# Patient Record
Sex: Male | Born: 1950 | Race: White | Hispanic: No
Health system: Southern US, Community
[De-identification: ages and names within clinical notes are randomized; demographics above are authoritative.]

## PROBLEM LIST (undated history)

## (undated) DIAGNOSIS — Z952 Presence of prosthetic heart valve: Secondary | ICD-10-CM

## (undated) DIAGNOSIS — I4891 Unspecified atrial fibrillation: Secondary | ICD-10-CM

## (undated) DIAGNOSIS — I2699 Other pulmonary embolism without acute cor pulmonale: Secondary | ICD-10-CM

## (undated) DIAGNOSIS — I82409 Acute embolism and thrombosis of unspecified deep veins of unspecified lower extremity: Secondary | ICD-10-CM

---

## 1999-04-27 DIAGNOSIS — Z950 Presence of cardiac pacemaker: Secondary | ICD-10-CM

## 1999-04-27 HISTORY — DX: Presence of cardiac pacemaker: Z95.0

## 1999-04-27 HISTORY — PX: MITRAL VALVE REPLACEMENT: SHX147

## 2019-09-14 ENCOUNTER — Encounter (HOSPITAL_COMMUNITY): Payer: Self-pay | Admitting: Primary Care

## 2019-09-14 ENCOUNTER — Emergency Department (HOSPITAL_COMMUNITY): Payer: Medicare HMO

## 2019-09-14 ENCOUNTER — Inpatient Hospital Stay (HOSPITAL_COMMUNITY)
Admission: EM | Admit: 2019-09-14 | Discharge: 2019-09-25 | DRG: 040 | Disposition: E | Payer: Medicare HMO | Attending: Internal Medicine | Admitting: Internal Medicine

## 2019-09-14 ENCOUNTER — Other Ambulatory Visit: Payer: Self-pay

## 2019-09-14 DIAGNOSIS — I2510 Atherosclerotic heart disease of native coronary artery without angina pectoris: Secondary | ICD-10-CM | POA: Diagnosis present

## 2019-09-14 DIAGNOSIS — F0390 Unspecified dementia without behavioral disturbance: Secondary | ICD-10-CM | POA: Diagnosis present

## 2019-09-14 DIAGNOSIS — E43 Unspecified severe protein-calorie malnutrition: Secondary | ICD-10-CM | POA: Diagnosis present

## 2019-09-14 DIAGNOSIS — E870 Hyperosmolality and hypernatremia: Secondary | ICD-10-CM | POA: Diagnosis not present

## 2019-09-14 DIAGNOSIS — L89152 Pressure ulcer of sacral region, stage 2: Secondary | ICD-10-CM | POA: Diagnosis present

## 2019-09-14 DIAGNOSIS — Z452 Encounter for adjustment and management of vascular access device: Secondary | ICD-10-CM

## 2019-09-14 DIAGNOSIS — I82A22 Chronic embolism and thrombosis of left axillary vein: Secondary | ICD-10-CM | POA: Diagnosis present

## 2019-09-14 DIAGNOSIS — Z951 Presence of aortocoronary bypass graft: Secondary | ICD-10-CM | POA: Diagnosis not present

## 2019-09-14 DIAGNOSIS — C7951 Secondary malignant neoplasm of bone: Secondary | ICD-10-CM | POA: Diagnosis present

## 2019-09-14 DIAGNOSIS — J9601 Acute respiratory failure with hypoxia: Secondary | ICD-10-CM | POA: Diagnosis not present

## 2019-09-14 DIAGNOSIS — M619 Calcification and ossification of muscle, unspecified: Secondary | ICD-10-CM | POA: Diagnosis present

## 2019-09-14 DIAGNOSIS — E46 Unspecified protein-calorie malnutrition: Secondary | ICD-10-CM | POA: Diagnosis not present

## 2019-09-14 DIAGNOSIS — Z20822 Contact with and (suspected) exposure to covid-19: Secondary | ICD-10-CM | POA: Diagnosis present

## 2019-09-14 DIAGNOSIS — R0602 Shortness of breath: Secondary | ICD-10-CM

## 2019-09-14 DIAGNOSIS — I059 Rheumatic mitral valve disease, unspecified: Secondary | ICD-10-CM | POA: Diagnosis present

## 2019-09-14 DIAGNOSIS — R1310 Dysphagia, unspecified: Secondary | ICD-10-CM | POA: Diagnosis not present

## 2019-09-14 DIAGNOSIS — N400 Enlarged prostate without lower urinary tract symptoms: Secondary | ICD-10-CM | POA: Diagnosis present

## 2019-09-14 DIAGNOSIS — G253 Myoclonus: Secondary | ICD-10-CM

## 2019-09-14 DIAGNOSIS — Z681 Body mass index (BMI) 19 or less, adult: Secondary | ICD-10-CM | POA: Diagnosis not present

## 2019-09-14 DIAGNOSIS — N136 Pyonephrosis: Secondary | ICD-10-CM | POA: Diagnosis present

## 2019-09-14 DIAGNOSIS — R972 Elevated prostate specific antigen [PSA]: Secondary | ICD-10-CM | POA: Diagnosis present

## 2019-09-14 DIAGNOSIS — Z950 Presence of cardiac pacemaker: Secondary | ICD-10-CM

## 2019-09-14 DIAGNOSIS — I4891 Unspecified atrial fibrillation: Secondary | ICD-10-CM | POA: Diagnosis present

## 2019-09-14 DIAGNOSIS — Z7189 Other specified counseling: Secondary | ICD-10-CM | POA: Diagnosis not present

## 2019-09-14 DIAGNOSIS — Z515 Encounter for palliative care: Secondary | ICD-10-CM | POA: Diagnosis not present

## 2019-09-14 DIAGNOSIS — I82B12 Acute embolism and thrombosis of left subclavian vein: Secondary | ICD-10-CM | POA: Diagnosis present

## 2019-09-14 DIAGNOSIS — M609 Myositis, unspecified: Secondary | ICD-10-CM | POA: Diagnosis present

## 2019-09-14 DIAGNOSIS — R4781 Slurred speech: Secondary | ICD-10-CM | POA: Diagnosis present

## 2019-09-14 DIAGNOSIS — R221 Localized swelling, mass and lump, neck: Secondary | ICD-10-CM | POA: Diagnosis present

## 2019-09-14 DIAGNOSIS — B961 Klebsiella pneumoniae [K. pneumoniae] as the cause of diseases classified elsewhere: Secondary | ICD-10-CM | POA: Diagnosis present

## 2019-09-14 DIAGNOSIS — N4889 Other specified disorders of penis: Secondary | ICD-10-CM | POA: Diagnosis present

## 2019-09-14 DIAGNOSIS — R1319 Other dysphagia: Secondary | ICD-10-CM | POA: Diagnosis not present

## 2019-09-14 DIAGNOSIS — Z7901 Long term (current) use of anticoagulants: Secondary | ICD-10-CM

## 2019-09-14 DIAGNOSIS — Z86711 Personal history of pulmonary embolism: Secondary | ICD-10-CM | POA: Diagnosis not present

## 2019-09-14 DIAGNOSIS — G9340 Encephalopathy, unspecified: Secondary | ICD-10-CM | POA: Diagnosis present

## 2019-09-14 DIAGNOSIS — I700 Atherosclerosis of aorta: Secondary | ICD-10-CM | POA: Diagnosis not present

## 2019-09-14 DIAGNOSIS — N5089 Other specified disorders of the male genital organs: Secondary | ICD-10-CM | POA: Diagnosis present

## 2019-09-14 DIAGNOSIS — Z86718 Personal history of other venous thrombosis and embolism: Secondary | ICD-10-CM

## 2019-09-14 DIAGNOSIS — Z79899 Other long term (current) drug therapy: Secondary | ICD-10-CM

## 2019-09-14 DIAGNOSIS — Z66 Do not resuscitate: Secondary | ICD-10-CM | POA: Diagnosis present

## 2019-09-14 DIAGNOSIS — G92 Toxic encephalopathy: Principal | ICD-10-CM

## 2019-09-14 DIAGNOSIS — R627 Adult failure to thrive: Secondary | ICD-10-CM | POA: Diagnosis present

## 2019-09-14 DIAGNOSIS — R933 Abnormal findings on diagnostic imaging of other parts of digestive tract: Secondary | ICD-10-CM | POA: Diagnosis not present

## 2019-09-14 DIAGNOSIS — R64 Cachexia: Secondary | ICD-10-CM | POA: Diagnosis present

## 2019-09-14 DIAGNOSIS — C159 Malignant neoplasm of esophagus, unspecified: Secondary | ICD-10-CM | POA: Diagnosis present

## 2019-09-14 DIAGNOSIS — C779 Secondary and unspecified malignant neoplasm of lymph node, unspecified: Secondary | ICD-10-CM | POA: Diagnosis present

## 2019-09-14 DIAGNOSIS — K769 Liver disease, unspecified: Secondary | ICD-10-CM | POA: Diagnosis present

## 2019-09-14 DIAGNOSIS — D630 Anemia in neoplastic disease: Secondary | ICD-10-CM | POA: Diagnosis present

## 2019-09-14 DIAGNOSIS — N320 Bladder-neck obstruction: Secondary | ICD-10-CM | POA: Diagnosis present

## 2019-09-14 DIAGNOSIS — N1330 Unspecified hydronephrosis: Secondary | ICD-10-CM | POA: Diagnosis present

## 2019-09-14 DIAGNOSIS — Z1612 Extended spectrum beta lactamase (ESBL) resistance: Secondary | ICD-10-CM | POA: Diagnosis present

## 2019-09-14 DIAGNOSIS — Z952 Presence of prosthetic heart valve: Secondary | ICD-10-CM

## 2019-09-14 DIAGNOSIS — K222 Esophageal obstruction: Secondary | ICD-10-CM | POA: Diagnosis present

## 2019-09-14 DIAGNOSIS — C799 Secondary malignant neoplasm of unspecified site: Secondary | ICD-10-CM | POA: Diagnosis present

## 2019-09-14 DIAGNOSIS — Z9359 Other cystostomy status: Secondary | ICD-10-CM

## 2019-09-14 DIAGNOSIS — L8990 Pressure ulcer of unspecified site, unspecified stage: Secondary | ICD-10-CM | POA: Diagnosis present

## 2019-09-14 DIAGNOSIS — R29715 NIHSS score 15: Secondary | ICD-10-CM | POA: Diagnosis present

## 2019-09-14 DIAGNOSIS — I495 Sick sinus syndrome: Secondary | ICD-10-CM | POA: Diagnosis present

## 2019-09-14 DIAGNOSIS — E876 Hypokalemia: Secondary | ICD-10-CM | POA: Diagnosis not present

## 2019-09-14 DIAGNOSIS — C770 Secondary and unspecified malignant neoplasm of lymph nodes of head, face and neck: Secondary | ICD-10-CM | POA: Diagnosis not present

## 2019-09-14 DIAGNOSIS — F17210 Nicotine dependence, cigarettes, uncomplicated: Secondary | ICD-10-CM | POA: Diagnosis present

## 2019-09-14 DIAGNOSIS — E785 Hyperlipidemia, unspecified: Secondary | ICD-10-CM | POA: Diagnosis present

## 2019-09-14 HISTORY — DX: Presence of prosthetic heart valve: Z95.2

## 2019-09-14 HISTORY — DX: Other pulmonary embolism without acute cor pulmonale: I26.99

## 2019-09-14 HISTORY — DX: Acute embolism and thrombosis of unspecified deep veins of unspecified lower extremity: I82.409

## 2019-09-14 HISTORY — DX: Unspecified atrial fibrillation: I48.91

## 2019-09-14 LAB — URINALYSIS, ROUTINE W REFLEX MICROSCOPIC
Bilirubin Urine: NEGATIVE
Glucose, UA: NEGATIVE mg/dL
Ketones, ur: NEGATIVE mg/dL
Nitrite: NEGATIVE
Protein, ur: NEGATIVE mg/dL
Specific Gravity, Urine: 1.025 (ref 1.005–1.030)
WBC, UA: >50 WBC/hpf — ABNORMAL HIGH (ref 0–5)
pH: 5 (ref 5.0–8.0)

## 2019-09-14 LAB — LACTIC ACID, PLASMA: Lactic Acid, Venous: 1.3 mmol/L (ref 0.5–1.9)

## 2019-09-14 LAB — DIFFERENTIAL
Abs Immature Granulocytes: 0.05 10*3/uL (ref 0.00–0.07)
Basophils Absolute: 0 10*3/uL (ref 0.0–0.1)
Basophils Relative: 0 %
Eosinophils Absolute: 0 10*3/uL (ref 0.0–0.5)
Eosinophils Relative: 0 %
Immature Granulocytes: 1 %
Lymphocytes Relative: 8 %
Lymphs Abs: 0.7 10*3/uL (ref 0.7–4.0)
Monocytes Absolute: 0.5 10*3/uL (ref 0.1–1.0)
Monocytes Relative: 6 %
Neutro Abs: 6.7 10*3/uL (ref 1.7–7.7)
Neutrophils Relative %: 85 %

## 2019-09-14 LAB — I-STAT CHEM 8, ED
BUN: 26 mg/dL — ABNORMAL HIGH (ref 8–23)
Calcium, Ion: 1.03 mmol/L — ABNORMAL LOW (ref 1.15–1.40)
Chloride: 115 mmol/L — ABNORMAL HIGH (ref 98–111)
Creatinine, Ser: 0.4 mg/dL — ABNORMAL LOW (ref 0.61–1.24)
Glucose, Bld: 113 mg/dL — ABNORMAL HIGH (ref 70–99)
HCT: 29 % — ABNORMAL LOW (ref 39.0–52.0)
Hemoglobin: 9.9 g/dL — ABNORMAL LOW (ref 13.0–17.0)
Potassium: 4.4 mmol/L (ref 3.5–5.1)
Sodium: 144 mmol/L (ref 135–145)
TCO2: 22 mmol/L (ref 22–32)

## 2019-09-14 LAB — CBC
HCT: 33.3 % — ABNORMAL LOW (ref 39.0–52.0)
Hemoglobin: 10.2 g/dL — ABNORMAL LOW (ref 13.0–17.0)
MCH: 27.9 pg (ref 26.0–34.0)
MCHC: 30.6 g/dL (ref 30.0–36.0)
MCV: 91.2 fL (ref 80.0–100.0)
Platelets: 233 10*3/uL (ref 150–400)
RBC: 3.65 MIL/uL — ABNORMAL LOW (ref 4.22–5.81)
RDW: 21.8 % — ABNORMAL HIGH (ref 11.5–15.5)
WBC: 8.2 10*3/uL (ref 4.0–10.5)
nRBC: 0 % (ref 0.0–0.2)

## 2019-09-14 LAB — COMPREHENSIVE METABOLIC PANEL
ALT: 21 U/L (ref 0–44)
AST: 21 U/L (ref 15–41)
Albumin: 1.6 g/dL — ABNORMAL LOW (ref 3.5–5.0)
Alkaline Phosphatase: 206 U/L — ABNORMAL HIGH (ref 38–126)
Anion gap: 9 (ref 5–15)
BUN: 22 mg/dL (ref 8–23)
CO2: 21 mmol/L — ABNORMAL LOW (ref 22–32)
Calcium: 7.8 mg/dL — ABNORMAL LOW (ref 8.9–10.3)
Chloride: 115 mmol/L — ABNORMAL HIGH (ref 98–111)
Creatinine, Ser: 0.48 mg/dL — ABNORMAL LOW (ref 0.61–1.24)
GFR calc Af Amer: >60 mL/min (ref >60)
GFR calc non Af Amer: >60 mL/min (ref >60)
Glucose, Bld: 119 mg/dL — ABNORMAL HIGH (ref 70–99)
Potassium: 4.1 mmol/L (ref 3.5–5.1)
Sodium: 145 mmol/L (ref 135–145)
Total Bilirubin: 0.7 mg/dL (ref 0.3–1.2)
Total Protein: 4.3 g/dL — ABNORMAL LOW (ref 6.5–8.1)

## 2019-09-14 LAB — PROTIME-INR
INR: 1.8 — ABNORMAL HIGH (ref 0.8–1.2)
Prothrombin Time: 20.3 seconds — ABNORMAL HIGH (ref 11.4–15.2)

## 2019-09-14 LAB — RAPID URINE DRUG SCREEN, HOSP PERFORMED
Amphetamines: NOT DETECTED
Barbiturates: NOT DETECTED
Benzodiazepines: NOT DETECTED
Cocaine: NOT DETECTED
Opiates: NOT DETECTED
Tetrahydrocannabinol: NOT DETECTED

## 2019-09-14 LAB — ETHANOL: Alcohol, Ethyl (B): <10 mg/dL (ref <10)

## 2019-09-14 LAB — AMMONIA: Ammonia: 20 umol/L (ref 9–35)

## 2019-09-14 LAB — TSH: TSH: 8.318 u[IU]/mL — ABNORMAL HIGH (ref 0.350–4.500)

## 2019-09-14 LAB — SARS CORONAVIRUS 2 BY RT PCR (HOSPITAL ORDER, PERFORMED IN ~~LOC~~ HOSPITAL LAB): SARS Coronavirus 2: NEGATIVE

## 2019-09-14 LAB — APTT: aPTT: 35 seconds (ref 24–36)

## 2019-09-14 LAB — CBG MONITORING, ED: Glucose-Capillary: 107 mg/dL — ABNORMAL HIGH (ref 70–99)

## 2019-09-14 LAB — VITAMIN B12: Vitamin B-12: 973 pg/mL — ABNORMAL HIGH (ref 180–914)

## 2019-09-14 MED ORDER — DOXEPIN HCL 25 MG PO CAPS
25.0000 mg | ORAL_CAPSULE | Freq: Every day | ORAL | Status: DC
  Filled 2019-09-14: qty 1

## 2019-09-14 MED ORDER — SODIUM CHLORIDE 0.9 % IV BOLUS
500.0000 mL | Freq: Once | INTRAVENOUS | Status: AC
  Administered 2019-09-14: 500 mL via INTRAVENOUS

## 2019-09-14 MED ORDER — HEPARIN BOLUS VIA INFUSION
3500.0000 [IU] | Freq: Once | INTRAVENOUS | Status: DC
  Filled 2019-09-14: qty 3500

## 2019-09-14 MED ORDER — IOHEXOL 300 MG/ML  SOLN
100.0000 mL | Freq: Once | INTRAMUSCULAR | Status: AC | PRN
  Administered 2019-09-14: 100 mL via INTRAVENOUS

## 2019-09-14 MED ORDER — TAMSULOSIN HCL 0.4 MG PO CAPS
0.4000 mg | ORAL_CAPSULE | Freq: Every day | ORAL | Status: DC
  Administered 2019-09-15 – 2019-09-19 (×4): 0.4 mg via ORAL
  Filled 2019-09-14 (×8): qty 1

## 2019-09-14 MED ORDER — HEPARIN (PORCINE) 25000 UT/250ML-% IV SOLN
1150.0000 [IU]/h | INTRAVENOUS | Status: DC
  Administered 2019-09-14: 850 [IU]/h via INTRAVENOUS
  Administered 2019-09-16: 1150 [IU]/h via INTRAVENOUS
  Administered 2019-09-16: 1100 [IU]/h via INTRAVENOUS
  Administered 2019-09-17: 1150 [IU]/h via INTRAVENOUS
  Filled 2019-09-14 (×3): qty 250

## 2019-09-14 MED ORDER — SODIUM CHLORIDE 0.9 % IV SOLN: INTRAVENOUS | Status: DC

## 2019-09-14 MED ORDER — MELATONIN 5 MG PO TABS
10.0000 mg | ORAL_TABLET | Freq: Every day | ORAL | Status: DC
  Administered 2019-09-15 – 2019-09-18 (×4): 10 mg via ORAL
  Filled 2019-09-14 (×5): qty 2

## 2019-09-14 MED ORDER — MIRTAZAPINE 15 MG PO TABS
15.0000 mg | ORAL_TABLET | Freq: Every day | ORAL | Status: DC
  Filled 2019-09-14: qty 1

## 2019-09-14 MED ORDER — ATORVASTATIN CALCIUM 80 MG PO TABS
80.0000 mg | ORAL_TABLET | Freq: Every day | ORAL | Status: DC
  Administered 2019-09-15 – 2019-09-21 (×6): 80 mg via ORAL
  Filled 2019-09-14 (×7): qty 1

## 2019-09-14 MED ORDER — HYDROMORPHONE HCL 1 MG/ML IJ SOLN
0.5000 mg | INTRAMUSCULAR | Status: DC | PRN
  Administered 2019-09-17 – 2019-09-18 (×2): 0.5 mg via INTRAVENOUS
  Administered 2019-09-19: 0.25 mg via INTRAVENOUS
  Administered 2019-09-19 – 2019-09-21 (×4): 1 mg via INTRAVENOUS
  Filled 2019-09-14 (×2): qty 1
  Filled 2019-09-14: qty 0.5
  Filled 2019-09-14 (×3): qty 1
  Filled 2019-09-14: qty 0.5

## 2019-09-14 MED ORDER — SENNOSIDES-DOCUSATE SODIUM 8.6-50 MG PO TABS
1.0000 | ORAL_TABLET | Freq: Every day | ORAL | Status: DC | PRN
  Filled 2019-09-14: qty 1

## 2019-09-14 MED ORDER — FLUOXETINE HCL 10 MG PO CAPS
10.0000 mg | ORAL_CAPSULE | Freq: Every day | ORAL | Status: DC
  Administered 2019-09-15 – 2019-09-21 (×6): 10 mg via ORAL
  Filled 2019-09-14 (×8): qty 1

## 2019-09-14 MED ORDER — METOPROLOL TARTRATE 25 MG PO TABS
25.0000 mg | ORAL_TABLET | Freq: Two times a day (BID) | ORAL | Status: DC
  Administered 2019-09-15 – 2019-09-19 (×9): 25 mg via ORAL
  Filled 2019-09-14 (×11): qty 1

## 2019-09-14 MED ORDER — SODIUM CHLORIDE 0.9 % IV SOLN
1.0000 g | INTRAVENOUS | Status: DC
  Administered 2019-09-14 – 2019-09-17 (×4): 1000 mg via INTRAVENOUS
  Filled 2019-09-14 (×6): qty 1

## 2019-09-14 MED ORDER — AMIODARONE HCL 200 MG PO TABS
200.0000 mg | ORAL_TABLET | Freq: Two times a day (BID) | ORAL | Status: DC
  Administered 2019-09-15 – 2019-09-21 (×12): 200 mg via ORAL
  Filled 2019-09-14 (×13): qty 1

## 2019-09-14 MED ORDER — NICOTINE 21 MG/24HR TD PT24
21.0000 mg | MEDICATED_PATCH | Freq: Every day | TRANSDERMAL | Status: DC
  Administered 2019-09-15 – 2019-09-23 (×8): 21 mg via TRANSDERMAL
  Filled 2019-09-14 (×9): qty 1

## 2019-09-14 MED ORDER — THIAMINE HCL 100 MG/ML IJ SOLN
500.0000 mg | Freq: Three times a day (TID) | INTRAVENOUS | Status: AC
  Administered 2019-09-14 – 2019-09-17 (×8): 500 mg via INTRAVENOUS
  Filled 2019-09-14 (×10): qty 5

## 2019-09-14 NOTE — ED Triage Notes (Signed)
Pt brought to ED from SNF via EMS after family discovered pt this AM around 7:30 with slurred speech, confusion. LKW 8PM yesterday. Hx afib on warfarin, pacemaker, mitral valve replacement. Pt alert and stable on arrival to ED, transferred to CT.

## 2019-09-14 NOTE — ED Provider Notes (Signed)
[facility] Emergency Department Provider Note MRN:  [id number]  Arrival date & time: 08/28/2019     Chief Complaint   Code Stroke   History of Present Illness   Jorge Kennedy is a 69 y.o. year-old male with a history of CAD, PE, pacemaker presenting to the ED with chief complaint of code stroke.  Waxing and waning altered mental status and slurred speech for the past 2 or 3 days.  Code stroke initiated prior to arrival.  Patient is altered and cannot provide history.  Per family, patient was very normal, much healthier, dancing at a wedding this past February, significant functional decline since that time.  [place] off a ladder back in November, had significant internal bleeding at that time, has not been the same since.  Has had an extended hospitalization during March and April at hospital in [place], discharged to a skilled nursing facility.  I was unable to obtain an accurate HPI, PMH, or ROS due to the patient's altered mental status.  Level 5 caveat.  Review of Systems  Positive for altered mental status, slurred speech.  Patient's Health History    Past Medical History:  Diagnosis Date  . Atrial fibrillation with RVR ([place])   . DVT (deep venous thrombosis) ([place])   . H/O mitral valve replacement with mechanical valve   . Pacemaker 2001  . PE (pulmonary thromboembolism) ([place])     Past Surgical History:  Procedure Laterality Date  . MITRAL VALVE REPLACEMENT  2001    No family history on file.  Social History   Socioeconomic History  . Marital status: Widowed    Spouse name: Not on file  . Number of children: Not on file  . Years of education: Not on file  . Highest education level: Not on file  Occupational History  . Not on file  Tobacco Use  . Smoking status: Not on file  Substance and Sexual Activity  . Alcohol use: Not on file  . Drug use: Not on file  . Sexual activity: Not on file  Other Topics Concern  . Not on file  Social  History Narrative  . Not on file   Social Determinants of Health   Financial Resource Strain:   . Difficulty of Paying Living Expenses:   Food Insecurity:   . Worried About Charity fundraiser in the Last Year:   . Arboriculturist in the Last Year:   Transportation Needs:   . Film/video editor (Medical):   Marland Kitchen Lack of Transportation (Non-Medical):   Physical Activity:   . Days of Exercise per Week:   . Minutes of Exercise per Session:   Stress:   . Feeling of Stress :   Social Connections:   . Frequency of Communication with Friends and Family:   . Frequency of Social Gatherings with Friends and Family:   . Attends Religious Services:   . Active Member of Clubs or Organizations:   . Attends Archivist Meetings:   Marland Kitchen Marital Status:   Intimate Partner Violence:   . Fear of Current or Ex-Partner:   . Emotionally Abused:   Marland Kitchen Physically Abused:   . Sexually Abused:      Physical Exam   Vitals:   08/30/2019 1315 09/22/2019 1345  BP: (!) 111/96 118/65  Pulse: 87 87  Resp: (!) 23 19  Temp:    SpO2: 97% 98%    CONSTITUTIONAL: Ill-appearing, NAD, cachectic NEURO: Awake, not oriented, moves all  extremities, mildly tremulous EYES:  eyes equal and reactive ENT/NECK:  no LAD, no JVD, solid mass to left lateral and anterior neck CARDIO: Regular rate, well-perfused, normal S1 and S2 PULM:  CTAB no wheezing or rhonchi GI/GU:  normal bowel sounds, non-distended, non-tender MSK/SPINE:  No gross deformities, no edema SKIN:  no rash, atraumatic PSYCH:  Appropriate speech and behavior  *Additional and/or pertinent findings included in MDM below  Diagnostic and Interventional Summary    EKG Interpretation  Date/Time:  Friday Sep 14 2019 11:10:01 EDT Ventricular Rate:  97 PR Interval:    QRS Duration: 153 QT Interval:  471 QTC Calculation: 544 R Axis:   -101 Text Interpretation: Atrial fibrillation Ventricular premature complex IVCD, consider atypical RBBB Abnormal  lateral Q waves Confirmed by Gerlene Fee [phone]) on 09/05/2019 12:19:18 PM      Labs Reviewed  Meda Klinefelter - Abnormal; Notable for the following components:      Result Value   Prothrombin Time 20.3 (*)    INR 1.8 (*)    All other components within normal limits  CBC - Abnormal; Notable for the following components:   RBC 3.65 (*)    Hemoglobin 10.2 (*)    HCT 33.3 (*)    RDW 21.8 (*)    All other components within normal limits  COMPREHENSIVE METABOLIC PANEL - Abnormal; Notable for the following components:   Chloride 115 (*)    CO2 21 (*)    Glucose, Bld 119 (*)    Creatinine, Ser 0.48 (*)    Calcium 7.8 (*)    Total Protein 4.3 (*)    Albumin 1.6 (*)    Alkaline Phosphatase 206 (*)    All other components within normal limits  URINALYSIS, ROUTINE W REFLEX MICROSCOPIC - Abnormal; Notable for the following components:   APPearance HAZY (*)    Hgb urine dipstick MODERATE (*)    Leukocytes,Ua MODERATE (*)    WBC, UA >50 (*)    Bacteria, UA RARE (*)    All other components within normal limits  VITAMIN B12 - Abnormal; Notable for the following components:   Vitamin B-12 973 (*)    All other components within normal limits  TSH - Abnormal; Notable for the following components:   TSH 8.318 (*)    All other components within normal limits  CBG MONITORING, ED - Abnormal; Notable for the following components:   Glucose-Capillary 107 (*)    All other components within normal limits  I-STAT CHEM 8, ED - Abnormal; Notable for the following components:   Chloride 115 (*)    BUN 26 (*)    Creatinine, Ser 0.40 (*)    Glucose, Bld 113 (*)    Calcium, Ion 1.03 (*)    Hemoglobin 9.9 (*)    HCT 29.0 (*)    All other components within normal limits  SARS CORONAVIRUS 2 BY RT PCR (HOSPITAL ORDER, [place] [facility])  ETHANOL  APTT  DIFFERENTIAL  RAPID URINE DRUG SCREEN, HOSP PERFORMED  AMMONIA  LACTIC ACID, PLASMA  VITAMIN B1  I-STAT CHEM 8, ED    CT CHEST  W CONTRAST  Final Result    CT ABDOMEN PELVIS W CONTRAST  Final Result    CT Soft Tissue Neck W Contrast  Final Result    CT HEAD CODE STROKE WO CONTRAST  Final Result      Medications  sodium chloride 0.9 % bolus 500 mL (0 mLs Intravenous Stopped 09/04/2019 1345)  iohexol (OMNIPAQUE)  300 MG/ML solution 100 mL (100 mLs Intravenous Contrast Given 09/17/2019 1404)     Procedures  /  Critical Care .Critical Care Performed by: Maudie Flakes, MD Authorized by: Maudie Flakes, MD   Critical care provider statement:    Critical care time (minutes):  45   Critical care was necessary to treat or prevent imminent or life-threatening deterioration of the following conditions:  CNS failure or compromise   Critical care was time spent personally by me on the following activities:  Discussions with consultants, evaluation of patient's response to treatment, examination of patient, ordering and performing treatments and interventions, ordering and review of laboratory studies, ordering and review of radiographic studies, pulse oximetry, re-evaluation of patient's condition, obtaining history from patient or surrogate and review of old charts    ED Course and Medical Decision Making  I have reviewed the triage vital signs, the nursing notes, and pertinent available records from the EMR.  Listed above are laboratory and imaging tests that I personally ordered, reviewed, and interpreted and then considered in my medical decision making (see below for details).      Concern for underlying malignancy given patient's appearance and significant decline over the past few months, as well as the solid mass to the left side of the neck.  Also considering UTI, metabolic disarray, on warfarin and a small stroke is a possibility, noncontrast CT showing no bleeding, not a TPA candidate given the anticoagulation.  Awaiting labs, CT imaging.  CT imaging is concerning for metastatic cancer, unclear primary,  likely tumor in the soft tissues of the neck with adjacent subclavian vein thrombus.  Also seems to be some bleeding within the tumor.  Given this finding and patient's severe malnutrition, will admit to hospitalist service.  Discussed case with neuro, who is following and providing recommendations.  Given the concern for possible meningeal spread of metastatic cancer, they are recommending LP.  However given patient's anticoagulation and elevated INR I feel it is best that this be performed under fluoroscopic guidance.  Barth Kirks. Sedonia Small, MD [place] [email or web address]  Final Clinical Impressions(s) / ED Diagnoses     ICD-10-CM   1. Neck mass  R22.1   2. Metastatic malignant neoplasm, unspecified site ([place])  C79.9   3. Encephalopathy  G93.40   4. Malnutrition, unspecified type [facility])  [place]     ED Discharge Orders    None       Discharge Instructions Discussed with and Provided to Patient:   Discharge Instructions   None       Maudie Flakes, MD 09/19/2019 [phone]

## 2019-09-14 NOTE — ED Notes (Signed)
Pt from [facility] at [facility]

## 2019-09-14 NOTE — Plan of Care (Addendum)
SAME DAY PROGRESS NOTE  EEG: Primary EEG negative for status epilepticus.  Formal read pending  Imaging: CT chest abdomen pelvis as well as CT soft tissue neck: -Scattered sclerotic and likely lytic lesions throughout the thoracic and lumbar spine worrisome for neoplastic process.  Primary not identified -3 hypoattenuating lesions in the liver cannot be definitively characterized-MRI with and without contrast could be used for further evaluation.  Based on the imaging findings, MRI thoracic and lumbar spine is likely to be higher yield initially for cancer. -Multifocal muscle edema and calcifications cannot be definitively characterized and more worrisome for polymyositis.  Consult with room or muscle biopsy. -Moderate bilateral hydronephrosis likely due to marked prostatomegaly. -Small bilateral pleural effusions and small volume of abdominopelvic ascites. -Large ill-defined soft tissue mass in the left upper back extending into the lower neck and left axilla-this has ill-defined margins and stranding in the adjacent fat which appears to be hemorrhage within the mass, this is most likely due to malignancy/metastatic disease with hemorrhage.  Left supraclavicular mass most consistent with malignant adenopathy.  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  Patient has a left-sided pacemaker.  Updated assessment and recommendations Note based on the above findings, his likely presentation is secondary to metastatic disease. It would be prudent to do a spinal tap to look for any metastatic meningeal spread-MRI is not possible due to pacemaker so LP is the best modality to pursue that but he is anticoagulated.  I would first complete all the metastatic and search for primary and then consider doing a spinal tap at some point.  I would not hold any of his  anticoagulation for his DVTs or PEs for now.  Spoke with Dr. Sedonia Small and relayed the plan.   -- Amie Portland, MD [facility] Pager: [phone] If 7pm to 7am, please call on call as listed on AMION.

## 2019-09-14 NOTE — Progress Notes (Signed)
EEG complete - results pending 

## 2019-09-14 NOTE — ED Notes (Signed)
Family speaking with dr Rory Percy

## 2019-09-14 NOTE — ED Notes (Signed)
Suprapubic catheter and rt arm power PICC in place

## 2019-09-14 NOTE — ED Notes (Signed)
Pt returned from CT °

## 2019-09-14 NOTE — Progress Notes (Signed)
Called to speak with RN to see if pt is back from CT. RN said it will be awhile before pt is back in the room.

## 2019-09-14 NOTE — Progress Notes (Signed)
Pt in CT will try back later

## 2019-09-14 NOTE — Consult Note (Addendum)
NEURO HOSPITALIST  CONSULT   Requesting Physician: Dr. Sedonia Small    Chief Complaint: slurred speech  History obtained from:  Family / OSH chart review HPI:                                                                                                                                         Jorge Kennedy is an 69 y.o. 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   Per EMS  patient was discovered  0730 with slurred speech and confusion.patient had a planned transport for PEG tube placement this morning, but code stroke was activated by EMS d/ t slurred speech and facial droop. Per family patient has had slurred speech for the past few days. He was admitted on 4/25 to a [facility] hospital; and has recently been moved to [facility] SNF in [place]. His health has been declining since February.   ED course:  CTH: no hemorrhage BP: 105/64 BG; 113   [facility]: patient admitted 08/19/19. PICC for kleibsiella pneumonia 09/09/19 failure PEG tube placement Has stage 2 decubitus sacral ulcer FTT; per family patietn has not eaten in about 3-4 weeks. He was on marinol and megace to improve appetite without improvement.  Patient also has a suprapubic catheter   Date last known well: 2000 Time last known well: 09/13/2019 tPA Given: no on coumadin and outside of window Modified Rankin: Rankin Score=2 NIHSS:15   Past Medical History:  Diagnosis Date  . Atrial fibrillation with RVR ([place])   . DVT (deep venous thrombosis) ([place])   . H/O mitral valve replacement with mechanical valve   . Pacemaker 2001  . PE (pulmonary thromboembolism) ([place])     Past Surgical History:  Procedure Laterality Date  . MITRAL VALVE REPLACEMENT  2001    No family history on file.       Social History:  has no history on file for tobacco,  alcohol, and drug.  Allergies: Not on File  Medications:  Current Facility-Administered Medications  Medication Dose Route Frequency Provider Last Rate Last Admin  . sodium chloride 0.9 % bolus 500 mL  500 mL Intravenous Once Jorge Flakes, MD       No current outpatient medications on file.     ROS:                                                                                                                                       ROS was performed and is negative except as noted in HPI    General Examination:                                                                                                      There were no vitals taken for this visit.  Physical Exam  Constitutional: appears undernourished, FTT  Eyes: Normal external eye and conjunctiva. HENT: Normocephalic, no lesions, without obvious abnormality.  Musculoskeletal-no joint tenderness, deformity or swelling Cardiovascular: Normal rate and regular rhythm.  Respiratory: Effort normal, non-labored breathing saturations WNL GI: lower abdominal dressing  Skin: WD sacral ulcer ( reported)  Neurological Examination Mental Status: Alert, not oriented, dysarthria. .  Moderate aphasia. Able to follow some simple commands without difficulty. Cranial Nerves:  blinks to threat bilaterally, face appears symmetric.midline tongue extension. Motor: Able to raise all 4 extremities anti gravity. Drift present in all 4 extremities.  Tone and bulk:normal tone throughout; no atrophy noted Sensory: light touch intact throughout, bilaterally Cerebellar:  significant asterixis present.  Gait:  deferred   Lab Results: Basic Metabolic Panel: Recent Labs  Lab 08/27/2019 1045  NA 144  K 4.4  CL 115*  GLUCOSE 113*  BUN 26*  CREATININE 0.40*    CBC: Recent Labs  Lab 09/24/2019 1045  HGB 9.9*  HCT  29.0*    CBG: Recent Labs  Lab 09/09/2019 1042  GLUCAP 107*    Imaging: CT HEAD CODE STROKE WO CONTRAST  Result Date: 09/13/2019 CLINICAL DATA:  Code stroke.  Slurred speech and facial droop. EXAM: CT HEAD WITHOUT CONTRAST TECHNIQUE: Contiguous axial images were obtained from the base of the skull through the vertex without intravenous contrast. COMPARISON:  None. FINDINGS: Brain: Advanced atrophy. Extensive white matter disease bilaterally which is symmetric and most consistent with chronic microvascular ischemia. Negative for acute infarct, hemorrhage, mass. Vascular: Normal arterial flow voids. Skull: Negative Sinuses/Orbits: Paranasal sinuses clear.  Negative orbit. Other: None ASPECTS ([place] Stroke Program Early CT Score) - Ganglionic level infarction (caudate, lentiform nuclei,  internal capsule, insula, M1-M3 cortex): 7 - Supraganglionic infarction (M4-M6 cortex): 3 Total score (0-10 with 10 being normal): 10 IMPRESSION: 1. No acute abnormality 2. ASPECTS is 10 3. Advanced atrophy and chronic microvascular ischemic change. 4. Preliminary results texted to [place] via AMION Electronically Signed   By: Jorge Kennedy M.D.   On: 09/02/2019 11:00       Jorge Morale, Jorge Kennedy, Jorge Kennedy [facility] [phone]  09/10/2019, 10:55 AM   Attending physician note to follow with Assessment and plan .   Assessment: 69 y.o. male  with PMH a fib RVR, CABG with pacemaker, mechanical mitral valve (on coumadin) who presented to [facility] ED as a code stroke for slurred speech.  CTH was negative for hemorrhage. Patient is not a candidate for TPA d/t coumadin. Per history and exam this is most likely toxic metabolic encephalopathy. D/t stroke risk factors will complete stroke work-up as well.  Stroke Risk Factors - atrial fibrillation    Recommendations: -CBC, CMP, ammonia, TSH, thiamine, B12, UA,  --please make sure labs have been drawn before ordering and giving thiamine 500 TID for 3 days. -  patient unable to have a an MRI d/t pacemaker -pt/ot/ speech -bedside swallow, NPO if fails bedside swallow - continue anticoagulation -high intensity statin if LDL > 70 -- ECHO   --Please page the Stroke team from 8am-4pm.   You can look them up on [email or web address]    Attending Addendum Patient seen and examined as an acute code stroke on the request of Dr. Sedonia Small. He is coming from a facility where he has been recovering after a prolonged series of hospitalizations which were in [place].  The patient has a past medical history of A. fib with RVR, myositis ossificans, CABG with pacemaker, mechanical mitral valve on Coumadin bridged with Lovenox, malnutrition, DVT, PE, recent admission for a Klebsiella UTI in [place], recuperating in a facility where EMS was called to take him to the hospital for a possible PEG tube placement.  They noticed that he has slurred speech.  They checked with the family who reported stuttering symptoms of slurred speech and altered mental status that have been now going on for weeks to months.  They report that in February he was very independent and cognitively very much more intact than he is now and it has been a very rapid progression downhill. Patient was brought in for sudden onset of altered mental status and slurred speech. Multiple providers including the ED provider and the stroke team nurses obtained a history and confirmed that his symptoms have now been ongoing for a while. Does not look like an acute code stroke situation because there is not a clear last known well time. Review of systems: Has been very sick recently with decreased appetite and has lost a lot of weight. Examination: General: Cachectic appearing man HEENT: Normocephalic atraumatic.  There is a vague palpable hard mass on the left supraclavicular fossa. Respiratory: Saturating well on room air Neurological exam Awake, not oriented to person place or time. Follow simple  commands.  Unable to follow complex commands. Moderate dysarthria Poor attention concentration Cranial nerves: Pupils appear equal round react light, extraocular movements appear unrestricted, blinks to threat from both sides, face appears symmetric, tongue midline. Motor exam: Able to raise all 4 extremities antigravity with twitching seen in all muscles with asterixis on outstretched arms as well. Sensory exam: Intact to touch all over Cerebellar: Unable to perform finger-nose-finger bilaterally.  Asterixis on outstretched arm  Lab  review-BMP reveals creatinine of 0.8, alkaline phosphatase 206.  CBC with no leukocytosis.  Anemia with hemoglobin 10.2.  Imaging-CT head code stroke reveals extensive atrophy and white matter disease without any evidence of acute evolving infarct.  No bleed.  Assessment: 69 year old man with above past medical history presenting for waxing and waning mentation with slurred speech. On examination he appears very cachectic with a solid mass in the left supraclavicular fossa. He appears altered with significant slurred speech and asterixis. It is unclear what his etiology for current presentation is but his symptoms are by no means acute and has been worsening over the last few months. He is not a candidate for TPA for the above reason Vessel imaging was not performed due to a nonfocal exam as above as well. Due to his recent history of weight loss, solid mass in the left supraclavicular fossa and cachectic appearance, malignancy work-up should be done.  Impression: Multifactorial toxic metabolic encephalopathy Evaluate for underlying malignancy Evaluate for seizures No evolving stroke seen on CT head.  MRI not possible due to noncompatible pacemaker.  Recommendations: -Check ammonia levels, Check TSH -Check B12 levels.  UA chest x-ray. -Check thiamine levels if not already on supplementation -Check CT chest abdomen pelvis as well as CT of the neck to  evaluate for the mass palpated on exam. -No bleed on CT head-can continue anticoagulation. -EEG -Supplement high-dose thiamine-500 mg 3 times daily for 3 days followed by 250 mg daily for 5 days and then followed by regular 100 mg supplementation. -We will follow with you.  -- Jorge Portland, MD [facility] Pager: [phone] If 7pm to 7am, please call on call as listed on AMION.  CRITICAL CARE ATTESTATION Performed by: Jorge Portland, MD Total critical care time: 55 minutes Critical care time was exclusive of separately billable procedures and treating other patients and/or supervising APPs/Residents/Students Critical care was necessary to treat or prevent imminent or life-threatening deterioration due to multifactorial toxic metabolic encephalopathy, strokelike symptoms. This patient is critically ill and at significant risk for neurological worsening and/or death and care requires constant monitoring. Critical care was time spent personally by me on the following activities: development of treatment plan with patient and/or surrogate as well as nursing, discussions with consultants, evaluation of patient's response to treatment, examination of patient, obtaining history from patient or surrogate, ordering and performing treatments and interventions, ordering and review of laboratory studies, ordering and review of radiographic studies, pulse oximetry, re-evaluation of patient's condition, participation in multidisciplinary rounds and medical decision making of high complexity in the care of this patient.

## 2019-09-14 NOTE — H&P (Signed)
Date: 08/29/2019               Patient Name:  Jorge Kennedy MRN: [id number]  DOB: 12-17-1950 Age / Sex: 69 y.o., male   PCP: System, Pcp Not In         Medical Service: Internal Medicine Teaching Service         Attending Physician: Dr. Heber [place], Rachel Moulds, DO    First Contact: [name], DO, Marland Kitchen Pager: [facility] [phone])  Second Contact: Koleen Distance, DO, [place] Pager: Meriel Flavors [phone])       After Hours (After 5p/  First Contact Pager: [phone]  weekends / holidays): Second Contact Pager: [phone]   Chief Complaint: Altered mental status   History of Present Illness:  Anguel Leamy is a 69 year old male with a pertinent past medical history of A. fib with RVR, CABG status post pacemaker, mechanical mitral valve disease warfarin), myositis ossificans, CAD, who presented to [facility] with AMS.   Patient was fully functional up until November when he had a fall with severe RP bleed. Did not seek immediate attention so developed myositis ossificans. He has taken a sharp decline in his health since then.   He was living with his daughter starting in March through April 25th where he was admitted in [place] for severe dehydration, somnolence. Diagnosed with afib.  Has not been eating for quite some time. Tried to have feeding tube placed on Sunday.  Failed swallow test today at [place]. Daughter mentions that he has had no appetite. 150 to 115 pounds since that time. At [place], he failed swallow test this morning.   Family was not aware of any possible metastatic disease or prostate issues. Had a suprapubic catheter placed at recent hospitalization in [facility].    ED Course:   Lab Orders     SARS Coronavirus 2 by RT PCR (hospital order, performed in [facility] hospital lab) Nasopharyngeal Nasopharyngeal Swab     Ethanol     Protime-INR     APTT     CBC     Differential     Comprehensive metabolic panel     Urine rapid drug screen (hosp performed)     Urinalysis, Routine w reflex  microscopic     Vitamin B1     Vitamin B12     TSH     Ammonia     Lactic acid, plasma     Comprehensive metabolic panel     Protime-INR     HIV Antibody (routine testing w rflx)     Magnesium     Heparin level (unfractionated)     CBC     CBC     Heparin level (unfractionated)     CBG monitoring, ED     I-stat chem 8, ed     I-stat chem 8, ED   Meds:  Current Meds  Medication Sig  . acetaminophen (TYLENOL) 325 MG tablet Take 650 mg by mouth every 6 (six) hours as needed for moderate pain.  Marland Kitchen amiodarone (PACERONE) 200 MG tablet Take 200 mg by mouth 2 (two) times daily.  Marland Kitchen atorvastatin (LIPITOR) 80 MG tablet Take 80 mg by mouth at bedtime.  Marland Kitchen doxepin (SINEQUAN) 25 MG capsule Take 25 mg by mouth daily.  Marland Kitchen dronabinol (MARINOL) 2.5 MG capsule Take 2.5 mg by mouth 2 (two) times daily before a meal.  . enoxaparin (LOVENOX) 80 MG/0.8ML injection Inject 80 mg into the skin in the morning and at bedtime.  Marland Kitchen ERTAPENEM SODIUM  IV Inject 1 each into the vein daily.  Marland Kitchen FLUoxetine (PROZAC) 10 MG capsule Take 10 mg by mouth daily.  . Heparin Lock Flush (HEPARIN FLUSH) 10 UNIT/ML SOLN injection Inject 5 mLs into the vein in the morning and at bedtime.  . melatonin 5 MG TABS Take 10 mg by mouth daily.  . metoprolol tartrate (LOPRESSOR) 25 MG tablet Take 25 mg by mouth 2 (two) times daily.  . mirtazapine (REMERON) 15 MG tablet Take 15 mg by mouth at bedtime.  . nicotine (NICODERM CQ - DOSED IN MG/24 HOURS) 21 mg/24hr patch Place 21 mg onto the skin daily.  . sennosides-docusate sodium (SENOKOT-S) 8.6-50 MG tablet Take 1 tablet by mouth daily as needed for constipation.  . sodium chloride 0.9 % infusion Inject 10 mLs into the vein in the morning and at bedtime.  . sucralfate (CARAFATE) 1 g tablet Take 1 g by mouth 2 (two) times daily.  . tamsulosin (FLOMAX) 0.4 MG CAPS capsule Take 0.4 mg by mouth daily.  Marland Kitchen warfarin (COUMADIN) 2.5 MG tablet Take 2.5 mg by mouth every Monday, Wednesday, and Friday.   . warfarin (COUMADIN) 5 MG tablet Take 5 mg by mouth See admin instructions. Takes 5 mg by mouth daily every Tuesday,Thursday,Saturday and Sunday.    Social:  Social History   Socioeconomic History  . Marital status: Widowed    Spouse name: Not on file  . Number of children: Not on file  . Years of education: Not on file  . Highest education level: Not on file  Occupational History  . Not on file  Tobacco Use  . Smoking status: Not on file  Substance and Sexual Activity  . Alcohol use: Not on file  . Drug use: Not on file  . Sexual activity: Not on file  Other Topics Concern  . Not on file  Social History Narrative  . Not on file   Social Determinants of Health   Financial Resource Strain:   . Difficulty of Paying Living Expenses:   Food Insecurity:   . Worried About Charity fundraiser in the Last Year:   . Arboriculturist in the Last Year:   Transportation Needs:   . Film/video editor (Medical):   Marland Kitchen Lack of Transportation (Non-Medical):   Physical Activity:   . Days of Exercise per Week:   . Minutes of Exercise per Session:   Stress:   . Feeling of Stress :   Social Connections:   . Frequency of Communication with Friends and Family:   . Frequency of Social Gatherings with Friends and Family:   . Attends Religious Services:   . Active Member of Clubs or Organizations:   . Attends Archivist Meetings:   Marland Kitchen Marital Status:   Intimate Partner Violence:   . Fear of Current or Ex-Partner:   . Emotionally Abused:   Marland Kitchen Physically Abused:   . Sexually Abused:      Family History: No family history on file.   Allergies: Allergies as of 09/13/2019  . (No Known Allergies)   Past Medical History:  Diagnosis Date  . Atrial fibrillation with RVR ([place])   . DVT (deep venous thrombosis) ([place])   . H/O mitral valve replacement with mechanical valve   . Pacemaker 2001  . PE (pulmonary thromboembolism) ([place])      Review of Systems: A complete ROS  was negative except as per HPI.   Physical Exam: Blood pressure 115/80, pulse 87, temperature 98.4  F (36.9 C), temperature source Oral, resp. rate 17, height 5\' 8"  (1.727 m), weight 52.2 kg, SpO2 96 %.  Physical Exam  Constitutional: He appears malnourished. He appears unhealthy. He appears cachectic.  HENT:  Head: Normocephalic and atraumatic.  Eyes: Pupils are equal, round, and reactive to light. EOM are normal.  Neck: No tracheal deviation present.  Cardiovascular: Normal rate and intact distal pulses. Exam reveals no gallop and no friction rub.  No murmur heard. Pulmonary/Chest: Effort normal and breath sounds normal. No respiratory distress.  Abdominal: Soft. Bowel sounds are normal. He exhibits no distension. There is no abdominal tenderness.  Musculoskeletal:        General: Tenderness (diffuse) present. No edema. Normal range of motion.     Cervical back: Normal range of motion.  Neurological: He is alert. He is disoriented. He displays weakness (generalized ) and tremor. He displays facial symmetry. No cranial nerve deficit or sensory deficit. Coordination normal.  Skin: Skin is warm and dry. Lesion (left supraclavicular region) noted.     Labs: CBC    Component Value Date/Time   WBC 8.2 08/30/2019 1116   RBC 3.65 (L) 09/04/2019 1116   HGB 10.2 (L) 09/01/2019 1116   HCT 33.3 (L) 09/06/2019 1116   PLT 233 09/22/2019 1116   MCV 91.2 08/31/2019 1116   MCH 27.9 08/30/2019 1116   MCHC 30.6 09/22/2019 1116   RDW 21.8 (H) 08/25/2019 1116   LYMPHSABS 0.7 08/26/2019 1116   MONOABS 0.5 09/07/2019 1116   EOSABS 0.0 09/01/2019 1116   BASOSABS 0.0 09/15/2019 1116     CMP     Component Value Date/Time   NA 145 09/15/2019 1116   K 4.1 09/20/2019 1116   CL 115 (H) 09/12/2019 1116   CO2 21 (L) 09/13/2019 1116   GLUCOSE 119 (H) 09/11/2019 1116   BUN 22 08/25/2019 1116   CREATININE 0.48 (L) 09/13/2019 1116   CALCIUM 7.8 (L) 08/25/2019 1116   PROT 4.3 (L) 08/31/2019 1116    ALBUMIN 1.6 (L) 09/04/2019 1116   AST 21 09/16/2019 1116   ALT 21 09/09/2019 1116   ALKPHOS 206 (H) 08/30/2019 1116   BILITOT 0.7 09/17/2019 1116   GFRNONAA >60 09/04/2019 1116   GFRAA >60 09/07/2019 1116    Imaging:  CT Soft Tissue Neck W Contrast: 1. Left subclavian vein thrombosis. Small amount of thrombus extends into the lower left jugular vein. The patient has a left-sided pacemaker. 2. Large ill-defined soft tissue mass in the left upper back extending into the lower neck and left axilla. This has ill-defined margins and stranding in the adjacent fat. There appears to be hemorrhage within the mass. This is most likely due to malignancy/metastatic disease with hemorrhage. Left supraclavicular mass most consistent with malignant adenopathy. 3. 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 4. These results were called by telephone at the time of interpretation on 09/03/2019 at 3:09 pm to provider [facility] , who verbally acknowledged these results.  CT Chest: Scattered sclerotic and likely lytic lesions throughout the thoracic and lumbar spine worrisome for neoplastic process. Primary lesion is not identified. MRI of the thoracic spine with and without contrast is recommended for further evaluation. 3 hypoattenuating lesions in the liver cannot be definitively characterized. MRI with and without contrast could be used for further evaluation. Based on imaging findings, MRI of the thoracic and lumbar spine is likely to be higher yield initially for cancer. Multifocal muscle edema and calcifications cannot be definitively characterized but are  most worrisome for polymyositis. Consider consult with rheumatology and/or muscle biopsy. Moderate bilateral hydronephrosis is likely due to marked prostatomegaly. Anasarca with small bilateral pleural effusions and small volume of abdominal and pelvic ascites. Aortic Atherosclerosis (ICD10-I70.0) and Emphysema  (ICD10-J43.9).  CT Abdomen and Pelvis: Scattered sclerotic and likely lytic lesions throughout the thoracic and lumbar spine worrisome for neoplastic process. Primary lesion is not identified. MRI of the thoracic spine with and without contrast is recommended for further evaluation. 3 hypoattenuating lesions in the liver cannot be definitively characterized. MRI with and without contrast could be used for further evaluation. Based on imaging findings, MRI of the thoracic and lumbar spine is likely to be higher yield initially for cancer. Multifocal muscle edema and calcifications cannot be definitively characterized but are most worrisome for polymyositis. Consider consult with rheumatology and/or muscle biopsy. Moderate bilateral hydronephrosis is likely due to marked prostatomegaly. Anasarca with small bilateral pleural effusions and small volume of abdominal and pelvic ascites. Aortic Atherosclerosis (ICD10-I70.0) and Emphysema (ICD10-J43.9)  CT Head: 1. No acute abnormality 2. ASPECTS is 10 3. Advanced atrophy and chronic microvascular ischemic change. 4. Preliminary results texted to Dr.Arora   EKG: personally reviewed my interpretation is atrial fibrillation   Assessment & Plan by Problem: Principal Problem:   Protein calorie malnutrition ([place]) Active Problems:   Encephalopathy   CAD (coronary artery disease)   Mizael Griesmer is a 69 y.o. with pertinent PMH of  A. fib with RVR, CABG status post pacemaker, mechanical mitral valve disease warfarin), myositis ossificans, CAD, who presented with altered mental status and a rapid decline in functional status and admit for further evaluation and management of his subacute encephalopathy. on hospital day 0  #Subacute Encephalopathy  Patient's history was provided by his son Zenia Resides, who states that the patient has had a pretty rapid decline in functional status since November of this past year (2020).  During this time the patient had been staying  with Romie Minus his other daughter who is the primary caregiver.  Patient has since moved to [place] to be closer with his other daughter Dorian Pod and to try to understand what is driving his acute decline in function.  We're still trying to collect all of the records from his previous hospitalizations.  Initial stroke work-up here in the hospital was negative for acute intracerebral process.  Patient is unable to get an MRI secondary to pacemaker.  On evaluation patient is alert and oriented to himself and his daughter but otherwise disoriented.  Patient previously carried the diagnosis of dementia.  Otherwise patient does have several centrally acting medications on his med review but is unlikely that this is the sole etiology of his altered mental status.  No overt signs of infection patient can get an lumbar puncture secondary to anticoagulation with warfarin and elevated INR.  There is concern for metastatic disease with unknown primary and possible CNS mets which could be an etiology of his altered mental status. -We'll keep him n.p.o. and assess his swallowing function.  Currently has a difficult time swallowing recently. - Limit centrally acting medications when possible   Occult malignancy: Recent imaging shows signs and symptoms concerning for metastatic cancer with a left supraclavicular node that is enlarged concerning for cancer. He also has enlarged prostate with a history of elevated PSA. -Patient has elevated INR will likely need to be switched to heparin  I will be decreased prior to any invasive procedures. -Consult for biopsy.  #Protein caloric malnutrition: Patient has had worsening  appetite since November with at least a 40 pound weight loss during this time.  He has tried mirtazapine and dronabinol without success.  #CAD s/p CABG pacemaker Patient has a significant cardiovascular history including multiple stents and CABG.  He is on atorvastatin 80 mg daily. -Continue atorvastatin  80 mg daily.  Urinary tract infection: Patient has a suprapubic catheter in place with unknown reason why it was placed.  Was recently discharged from hospital in [place] started on ertapenem secondary to Klebsiella UTI he is completed 3 days of treatment will need a full 10 days course.  We'll continue Pentam during his hospitalization.  -Continue ertapenem for 7 more days possible concern for ESBL due to antibiotic choice.  Atrial fibrillation: Patient is currently in atrial fibrillation and takes amiodarone 200 mg twice daily and metoprolol 25 mg twice daily.  He is currently anticoagulated with warfarin. -Continue home atrial fibrillation medications -We'll switch to heparin for anticoagulation due to likely need for biopsy.  Sick sinus syndrome - Status post pacemaker placement  Hyperlipidemia -Continue atorvastatin 80 mg  Sacral decubitus ulcer Patient has a chronic decubitus ulcer and end-stage. -We'll get wound care to manage this.   Diet: NPO VTE: Heparin IVF: None,None Code: Full  Prior to Admission Living Arrangement: Home, living with daughter Anticipated Discharge Location: TBD Barriers to Discharge: ongoing medical management   Dispo: Admit patient to Inpatient with expected length of stay greater than 2 midnights.  Signed: [name], MD 09/01/2019, 7:33 PM  Pager: [phone]

## 2019-09-14 NOTE — ED Notes (Signed)
Pt transported to CT ?

## 2019-09-14 NOTE — Code Documentation (Signed)
Stroke Response Nurse Documentation Code Documentation  Jorge Kennedy is a 69 y.o. male arriving to [place] ED via [facility] on 09/24/2019 with past medical hx of afib with RVR, anorexia. Code stroke was activated by EMS. Patient from [place] (SNF) where he was LKW at [place] last night per family. LKW tentative as family states that patient was exhibiting mild symptoms at time but they attributed them to a sleep aid medication that patient had received. Planned transport for PEG tube placement this morning and stroke symptoms noted by EMS at that time, complaining of slurred speech and facial droop. On lovenox treatment dose. Stroke team met patient at the bridge, unable to obtain labs at bridge will obtain following imaging, patient cleared for CT by ED PA . Patient to CT with team. NIHSS 15, see documentation for details and code stroke times. Patient with disoriented, left facial droop, bilateral arm weakness, bilateral leg weakness, Expressive aphasia  and dysarthria  on exam. The following imaging was completed:  CT Head. Patient is not a candidate for tPA due to being outside of window. No stroke suspected per Dr. Rory Percy, medical work-up planned with q2h mNIHSS and VS. Bedside handoff with ED RN April.    [place]  Stroke Response RN

## 2019-09-14 NOTE — ED Notes (Signed)
EEG in Progress at Bedside.

## 2019-09-14 NOTE — Progress Notes (Signed)
ANTICOAGULATION CONSULT NOTE - Initial Consult  Pharmacy Consult for heparin Indication: VTE Treatment  No Known Allergies  Patient Measurements: Height: 5\' 8"  (172.7 cm) Weight: 52.2 kg (115 lb) IBW/kg (Calculated) : 68.4 Heparin Dosing Weight: 52.2 kg  Vital Signs: Temp: 98.4 F (36.9 C) (05/21 1117) Temp Source: Oral (05/21 1117) BP: 115/80 (05/21 1600) Pulse Rate: 87 (05/21 1600)  Labs: Recent Labs    09/09/2019 1045 09/13/2019 1116  HGB 9.9* 10.2*  HCT 29.0* 33.3*  PLT  --  233  APTT  --  35  LABPROT  --  20.3*  INR  --  1.8*  CREATININE 0.40* 0.48*    Estimated Creatinine Clearance: 63.4 mL/min (A) (by C-G formula based on SCr of 0.48 mg/dL (L)).   Medical History: Past Medical History:  Diagnosis Date  . Atrial fibrillation with RVR ([place])   . DVT (deep venous thrombosis) ([place])   . H/O mitral valve replacement with mechanical valve   . Pacemaker 2001  . PE (pulmonary thromboembolism) [facility])     Assessment: 69 year old male presenting with slurred speech and confusion. Pharmacy consulted to dose heparin for VTE treatment.  Patient on warfarin outpatient for atrial fibrillation. INR currently 1.8, last dose of warfarin 09/13/19.  Home warfarin regimen: 5mg  on Tuesday, Thursday, Saturday and Sunday.   Goal of Therapy:  Heparin level 0.3-0.7 units/ml Monitor platelets by anticoagulation protocol: Yes   Plan:  Heparin IV 3500 bolus x1 Start heparin infusion at 850 units/hour 6-hour heparin level at 0200 Daily heparin level and CBC Monitor for signs and symptoms of bleed Follow-up plans to resume home warfarin     Santa Abdelrahman L. Devin Going, [place] PGY1 Pharmacy Resident [phone] 09/16/2019      7:30 PM  Please check AMION for all [place] phone numbers After 10:00 PM, call the [place] [phone]

## 2019-09-14 NOTE — Procedures (Signed)
ELECTROENCEPHALOGRAM REPORT   Patient: Jorge Kennedy       Room #: [id number] EEG No. ID: [id number] Age: 69 y.o.        Sex: male Requesting Physician: Heber [place] Report Date:  09/01/2019        Interpreting Physician: Alexis Goodell  History: Jorge Kennedy is an 69 y.o. male with altered mental status  Medications:  Heparin, Pacerone, Lipitor, Invanz, Lopressor, Flomax  Conditions of Recording:  This is a 21 channel routine scalp EEG performed with bipolar and monopolar montages arranged in accordance to the international 10/20 system of electrode placement. One channel was dedicated to EKG recording.  The patient is in the awake and drowsy states.  Description:  Despite the state of the patient the background activity remains unchanged.  The background consists of a low voltage, symmetrical, fairly well organized, 6 Hz theta activity that is diffusely distributed.   No epileptiform activity is noted.  Hyperventilation and intermittent photic stimulation were not performed.  IMPRESSION: This is an abnormal EEG secondary to general background slowing.  This finding may be seen with a diffuse disturbance that is etiologically nonspecific, but may include a metabolic encephalopathy, among other possibilities.  No epileptiform activity was noted.     Alexis Goodell, MD Neurology [phone] 09/02/2019, 9:19 PM

## 2019-09-14 NOTE — ED Notes (Signed)
EEG at Bedside; this RN will reassess this pt after completion of EEG

## 2019-09-15 ENCOUNTER — Inpatient Hospital Stay (HOSPITAL_COMMUNITY): Payer: Medicare HMO

## 2019-09-15 DIAGNOSIS — N1330 Unspecified hydronephrosis: Secondary | ICD-10-CM

## 2019-09-15 DIAGNOSIS — Z952 Presence of prosthetic heart valve: Secondary | ICD-10-CM

## 2019-09-15 DIAGNOSIS — Z9359 Other cystostomy status: Secondary | ICD-10-CM

## 2019-09-15 DIAGNOSIS — M609 Myositis, unspecified: Secondary | ICD-10-CM

## 2019-09-15 DIAGNOSIS — E46 Unspecified protein-calorie malnutrition: Secondary | ICD-10-CM

## 2019-09-15 DIAGNOSIS — C799 Secondary malignant neoplasm of unspecified site: Secondary | ICD-10-CM | POA: Diagnosis present

## 2019-09-15 DIAGNOSIS — I700 Atherosclerosis of aorta: Secondary | ICD-10-CM

## 2019-09-15 DIAGNOSIS — Z86711 Personal history of pulmonary embolism: Secondary | ICD-10-CM

## 2019-09-15 DIAGNOSIS — I82A22 Chronic embolism and thrombosis of left axillary vein: Secondary | ICD-10-CM

## 2019-09-15 DIAGNOSIS — R221 Localized swelling, mass and lump, neck: Secondary | ICD-10-CM

## 2019-09-15 LAB — PROTIME-INR
INR: 1.8 — ABNORMAL HIGH (ref 0.8–1.2)
Prothrombin Time: 20.6 seconds — ABNORMAL HIGH (ref 11.4–15.2)

## 2019-09-15 LAB — HEPARIN LEVEL (UNFRACTIONATED)
Heparin Unfractionated: 0.18 IU/mL — ABNORMAL LOW (ref 0.30–0.70)
Heparin Unfractionated: 0.24 IU/mL — ABNORMAL LOW (ref 0.30–0.70)
Heparin Unfractionated: 0.33 IU/mL (ref 0.30–0.70)

## 2019-09-15 LAB — COMPREHENSIVE METABOLIC PANEL
ALT: 18 U/L (ref 0–44)
AST: 18 U/L (ref 15–41)
Albumin: 1.6 g/dL — ABNORMAL LOW (ref 3.5–5.0)
Alkaline Phosphatase: 171 U/L — ABNORMAL HIGH (ref 38–126)
Anion gap: 8 (ref 5–15)
BUN: 17 mg/dL (ref 8–23)
CO2: 20 mmol/L — ABNORMAL LOW (ref 22–32)
Calcium: 7.7 mg/dL — ABNORMAL LOW (ref 8.9–10.3)
Chloride: 117 mmol/L — ABNORMAL HIGH (ref 98–111)
Creatinine, Ser: 0.49 mg/dL — ABNORMAL LOW (ref 0.61–1.24)
Glucose, Bld: 74 mg/dL (ref 70–99)
Potassium: 3.3 mmol/L — ABNORMAL LOW (ref 3.5–5.1)
Sodium: 145 mmol/L (ref 135–145)
Total Bilirubin: 0.5 mg/dL (ref 0.3–1.2)
Total Protein: 4.1 g/dL — ABNORMAL LOW (ref 6.5–8.1)

## 2019-09-15 LAB — TECHNOLOGIST SMEAR REVIEW

## 2019-09-15 LAB — MAGNESIUM: Magnesium: 1.9 mg/dL (ref 1.7–2.4)

## 2019-09-15 LAB — CBC
HCT: 28.2 % — ABNORMAL LOW (ref 39.0–52.0)
Hemoglobin: 8.7 g/dL — ABNORMAL LOW (ref 13.0–17.0)
MCH: 27.9 pg (ref 26.0–34.0)
MCHC: 30.9 g/dL (ref 30.0–36.0)
MCV: 90.4 fL (ref 80.0–100.0)
Platelets: 193 10*3/uL (ref 150–400)
RBC: 3.12 MIL/uL — ABNORMAL LOW (ref 4.22–5.81)
RDW: 21.4 % — ABNORMAL HIGH (ref 11.5–15.5)
WBC: 6.5 10*3/uL (ref 4.0–10.5)
nRBC: 0 % (ref 0.0–0.2)

## 2019-09-15 LAB — HIV ANTIBODY (ROUTINE TESTING W REFLEX): HIV Screen 4th Generation wRfx: NONREACTIVE

## 2019-09-15 LAB — RETICULOCYTES
Immature Retic Fract: 33.2 % — ABNORMAL HIGH (ref 2.3–15.9)
RBC.: 3.07 MIL/uL — ABNORMAL LOW (ref 4.22–5.81)
Retic Count, Absolute: 96.7 10*3/uL (ref 19.0–186.0)
Retic Ct Pct: 3.2 % — ABNORMAL HIGH (ref 0.4–3.1)

## 2019-09-15 LAB — CK: Total CK: 51 U/L (ref 49–397)

## 2019-09-15 LAB — PHOSPHORUS: Phosphorus: 2.9 mg/dL (ref 2.5–4.6)

## 2019-09-15 LAB — PSA: Prostatic Specific Antigen: 3.45 ng/mL (ref 0.00–4.00)

## 2019-09-15 MED ORDER — GERHARDT'S BUTT CREAM
TOPICAL_CREAM | CUTANEOUS | Status: DC | PRN
  Administered 2019-09-15: 1 via TOPICAL
  Filled 2019-09-15: qty 1

## 2019-09-15 MED ORDER — POTASSIUM CHLORIDE CRYS ER 20 MEQ PO TBCR
40.0000 meq | EXTENDED_RELEASE_TABLET | Freq: Two times a day (BID) | ORAL | Status: AC
  Administered 2019-09-15 (×2): 40 meq via ORAL
  Filled 2019-09-15 (×2): qty 2

## 2019-09-15 MED ORDER — RESOURCE THICKENUP CLEAR PO POWD
ORAL | Status: DC | PRN
  Filled 2019-09-15: qty 125

## 2019-09-15 MED ORDER — SODIUM CHLORIDE 0.9% FLUSH: 10.0000 mL | INTRAVENOUS | Status: DC | PRN

## 2019-09-15 MED ORDER — MAGNESIUM SULFATE 2 GM/50ML IV SOLN
2.0000 g | Freq: Once | INTRAVENOUS | Status: AC
  Administered 2019-09-15: 2 g via INTRAVENOUS
  Filled 2019-09-15: qty 50

## 2019-09-15 MED ORDER — CHLORHEXIDINE GLUCONATE CLOTH 2 % EX PADS
6.0000 | MEDICATED_PAD | Freq: Every day | CUTANEOUS | Status: DC
  Administered 2019-09-15 – 2019-09-21 (×7): 6 via TOPICAL

## 2019-09-15 MED ORDER — SODIUM CHLORIDE 0.9% FLUSH
10.0000 mL | Freq: Two times a day (BID) | INTRAVENOUS | Status: DC
  Administered 2019-09-15 – 2019-09-19 (×9): 10 mL
  Administered 2019-09-20: 20 mL
  Administered 2019-09-20 – 2019-09-21 (×2): 10 mL

## 2019-09-15 NOTE — Consult Note (Signed)
Chief Complaint: Patient was seen in consultation today for left neck mass/left supraclavicular lymph node biopsy.  Referring Physician(s): Joni Reining  Supervising Physician: Markus Daft  Patient Status: [facility] - In-pt  History of Present Illness: Jorge Kennedy is a 69 y.o. male with a past medical history significant for a.fib with RVR, CAD s/p CABG, SSS s/p pacemaker placement, mitral valve replacement on warfarin, myositis ossificans who presented to [facility] ED on 5/21 with complaints of AMS and slurred speech. A code stroke was called CT head negative for hemorrhage and his AMS was felt most likely to be due to toxic metabolic encephalopathy. His daughter reported that he had been experiencing a decline in health since a fall in November which resulted in a severe RP bleed, medical attention was not sought at that time and he developed myositis ossificans. She reports he has had poor appetite, weight loss and worsening confusion since that time - he was originally being followed and worked up for these issues at a hospital in [facility] where he was living with his daughter Jorge Kennedy, however as his health declined further decision was made to transfer him to a SNF here in [place] ([place]) on 08/19/19 to be closer to his other daughter Jorge Kennedy. Per chart he had an SP catheter placed for unclear reasons and a g-tube was attempted to be placed but was unsuccessful. He recently failed a swallow evaluation at SNF and was planned for second attempt at g-tube placement on the day he presented to the ED, however when EMS arrived for transport they were concerned for possible stroke-like symptoms.  He was admitted for further evaluation of his AMS and imaging showed concern for metastatic disease of unknown primary. He was noted to have a prostatomegaly, scattered sclerotic and likely lytic lesions throughout the thoracic/lumbar spine. A CT soft tissue neck w/contrast showed left subclavian vein thrombosis and a large  ill-define soft tissue mass in the left upper back extending into the lower neck and left axilla - IR has been asked to perform a biopsy of this neck mass/supraclavicular lymph node to further guide care.  Patient seen in his room today, he is mumbling but difficult to understand what he is saying. Patient's brother, daughter and son-in-law present during exam today - they confirm the history as detailed above and also tell me that his only complaint is pain when touching the upper portion of his back, there is no pain at around the left neck mass that they have noted. They tell me he has had no appetite and has not eaten in several weeks. They are concerned about the possibility of metastatic cancer and would like to proceed with a biopsy to hopefully get some more information.  Past Medical History:  Diagnosis Date  . Atrial fibrillation with RVR ([place])   . DVT (deep venous thrombosis) ([employer] [facility] [place])   . H/O mitral valve replacement with mechanical valve   . Pacemaker 2001  . PE (pulmonary thromboembolism) ([place])     Past Surgical History:  Procedure Laterality Date  . MITRAL VALVE REPLACEMENT  2001    Allergies: Patient has no known allergies.  Medications: Prior to Admission medications   Medication Sig Start Date End Date Taking? Authorizing Provider  acetaminophen (TYLENOL) 325 MG tablet Take 650 mg by mouth every 6 (six) hours as needed for moderate pain.   Yes [provider]  amiodarone (PACERONE) 200 MG tablet Take 200 mg by mouth 2 (two) times daily.   Yes [provider]  atorvastatin (LIPITOR) 80 MG tablet Take 80 mg by mouth at bedtime.   Yes [provider]  doxepin (SINEQUAN) 25 MG capsule Take 25 mg by mouth daily.   Yes [provider]  dronabinol (MARINOL) 2.5 MG capsule Take 2.5 mg by mouth 2 (two) times daily before a meal.   Yes [provider]  enoxaparin (LOVENOX) 80 MG/0.8ML injection Inject 80 mg into the skin in the morning and  at bedtime.   Yes [provider]  ERTAPENEM SODIUM IV Inject 1 each into the vein daily.   Yes [provider]  FLUoxetine (PROZAC) 10 MG capsule Take 10 mg by mouth daily.   Yes [provider]  Heparin Lock Flush (HEPARIN FLUSH) 10 UNIT/ML SOLN injection Inject 5 mLs into the vein in the morning and at bedtime.   Yes [provider]  melatonin 5 MG TABS Take 10 mg by mouth daily.   Yes [provider]  metoprolol tartrate (LOPRESSOR) 25 MG tablet Take 25 mg by mouth 2 (two) times daily.   Yes [provider]  mirtazapine (REMERON) 15 MG tablet Take 15 mg by mouth at bedtime.   Yes [provider]  nicotine (NICODERM CQ - DOSED IN MG/24 HOURS) 21 mg/24hr patch Place 21 mg onto the skin daily.   Yes [provider]  sennosides-docusate sodium (SENOKOT-S) 8.6-50 MG tablet Take 1 tablet by mouth daily as needed for constipation.   Yes [provider]  sodium chloride 0.9 % infusion Inject 10 mLs into the vein in the morning and at bedtime.   Yes [provider]  sucralfate (CARAFATE) 1 g tablet Take 1 g by mouth 2 (two) times daily.   Yes [provider]  tamsulosin (FLOMAX) 0.4 MG CAPS capsule Take 0.4 mg by mouth daily.   Yes [provider]  warfarin (COUMADIN) 2.5 MG tablet Take 2.5 mg by mouth every Monday, Wednesday, and Friday.   Yes [provider]  warfarin (COUMADIN) 5 MG tablet Take 5 mg by mouth See admin instructions. Takes 5 mg by mouth daily every Tuesday,Thursday,Saturday and Sunday.   Yes [provider]     No family history on file.  Social History   Socioeconomic History  . Marital status: Widowed    Spouse name: Not on file  . Number of children: Not on file  . Years of education: Not on file  . Highest education level: Not on file  Occupational History  . Not on file  Tobacco Use  . Smoking status: Not on file  Substance and Sexual Activity   . Alcohol use: Not on file  . Drug use: Not on file  . Sexual activity: Not on file  Other Topics Concern  . Not on file  Social History Narrative  . Not on file   Social Determinants of Health   Financial Resource Strain:   . Difficulty of Paying Living Expenses:   Food Insecurity:   . Worried About Charity fundraiser in the Last Year:   . Arboriculturist in the Last Year:   Transportation Needs:   . Film/video editor (Medical):   Marland Kitchen Lack of Transportation (Non-Medical):   Physical Activity:   . Days of Exercise per Week:   . Minutes of Exercise per Session:   Stress:   . Feeling of Stress :   Social Connections:   . Frequency of Communication with Friends and Family:   . Frequency of  Social Gatherings with Friends and Family:   . Attends Religious Services:   . Active Member of Clubs or Organizations:   . Attends Archivist Meetings:   Marland Kitchen Marital Status:      Review of Systems: A 12 point ROS discussed and pertinent positives are indicated in the HPI above.  All other systems are negative.  Review of Systems  Unable to perform ROS: Mental status change    Vital Signs: BP (!) (P) 104/57 (BP Location: Left Arm)   Pulse (P) 89   Temp (P) 98.2 F (36.8 C) (Axillary)   Resp (P) 16   Ht 5\' 8"  (1.727 m)   Wt 115 lb (52.2 kg)   SpO2 (P) 98%   BMI 17.49 kg/m   Physical Exam Vitals and nursing note reviewed.  Constitutional:      General: He is not in acute distress.    Appearance: He is ill-appearing.     Comments: Patient mumbling but I cannot understand what he is saying, he is agitated with RN placing SCDs. He does clearly say no when asked if he is having pain anywhere. Patient's daughter, son-in-law and brother present during exam.  (+) cachectic   HENT:     Head: Normocephalic.     Mouth/Throat:     Mouth: Mucous membranes are moist.     Pharynx: Oropharynx is clear. No oropharyngeal exudate or posterior oropharyngeal erythema.   Cardiovascular:     Rate and Rhythm: Normal rate and regular rhythm.     Comments: (+) surgical scar over sternum (+) pacemaker Pulmonary:     Effort: Pulmonary effort is normal.     Breath sounds: Normal breath sounds.     Comments: Anterior exam only due patient agitation Abdominal:     General: There is no distension.     Palpations: Abdomen is soft.     Tenderness: There is no abdominal tenderness.  Lymphadenopathy:     Cervical: Cervical adenopathy (large left neck mass, non tender, firm.) present.  Skin:    General: Skin is warm and dry.     Comments: Diffuse purple/blotchy discoloration to skin  Neurological:     Mental Status: He is alert. He is disoriented and confused.     Cranial Nerves: No facial asymmetry.     Motor: Tremor present.      MD Evaluation Airway: WNL Heart: WNL Abdomen: WNL Chest/ Lungs: WNL ASA  Classification: 3 Mallampati/Airway Score: Two(Difficult to assess - patient inconsistent with following commands)   Imaging: CT Soft Tissue Neck W Contrast  Result Date: 09/18/2019 CLINICAL DATA:  Left neck mass. Confusion and slurred speech. On Coumadin EXAM: CT NECK WITH CONTRAST TECHNIQUE: Multidetector CT imaging of the neck was performed using the standard protocol following the bolus administration of intravenous contrast. CONTRAST:  187mL OMNIPAQUE IOHEXOL 300 MG/ML  SOLN COMPARISON:  None. FINDINGS: Pharynx and larynx: Normal. No mass or swelling. Salivary glands: No inflammation, mass, or stone. Thyroid: Negative Lymph nodes: Left supraclavicular soft tissue mass measuring 3.5 x 4 cm with adjacent surrounding stranding and soft tissue densities. This may represent malignant adenopathy. Extensive stranding in the fat in the lower neck and axilla on the left. No enlarged lymph nodes in the right neck. Vascular: Left sided pacemaker. Catheter in the right subclavian vein compatible with PICC line. There is extensive thrombosis of the left subclavian  vein. Small amount of thrombus extends into the lower left jugular vein. Atherosclerotic calcification in the aortic arch and carotid  bifurcation. Limited intracranial: Negative Visualized orbits: Not significantly evaluated. Mastoids and visualized paranasal sinuses: Paranasal sinuses clear. Mastoid clear. Skeleton: Multiple small sclerotic lesions are seen in the cervical and thoracic spine and in the manubrium. Appearance most consistent with metastatic disease. Correlate with prostate cancer. Mild compression fracture T5 appears chronic. Cervical spondylosis C4-5 C5-6 and C6-7. Upper chest: Chest CT from today reported separately Other: Extensive soft tissue swelling in the posterior left upper back extending into the lower neck. Portions of the swelling show high density consistent with hemorrhage. There also are punctate calcifications within the soft tissue swelling of uncertain etiology, but may be related to dystrophic calcification in tumor. There is stranding in the fat in the left lateral and lower neck and left axilla. Soft tissue mass in the left supraclavicular fossa may be a conglomeration of enlarged lymph nodes. IMPRESSION: 1. Left subclavian vein thrombosis. Small amount of thrombus extends into the lower left jugular vein. The patient has a left-sided pacemaker. 2. Large ill-defined soft tissue mass in the left upper back extending into the lower neck and left axilla. This has ill-defined margins and stranding in the adjacent fat. There appears to be hemorrhage within the mass. This is most likely due to malignancy/metastatic disease with hemorrhage. Left supraclavicular mass most consistent with malignant adenopathy. 3. 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 4. These results were called by telephone at the time of interpretation on 09/07/2019 at 3:09 pm to provider [facility] , who verbally acknowledged these results. Electronically Signed   By: Franchot Gallo M.D.   On: 09/09/2019 15:10   CT CHEST W CONTRAST  Result Date: 08/28/2019 CLINICAL DATA:  Confusion and slurred speech. EXAM: CT CHEST, ABDOMEN, AND PELVIS WITH CONTRAST TECHNIQUE: Multidetector CT imaging of the chest, abdomen and pelvis was performed following the standard protocol during bolus administration of intravenous contrast. CONTRAST:  100 mL OMNIPAQUE IOHEXOL 300 MG/ML  SOLN COMPARISON:  None. FINDINGS: CT CHEST FINDINGS Cardiovascular: The patient is status post CABG with a pacing device in place. Heart size is normal. No pericardial effusion. Mediastinum/Nodes: No enlarged mediastinal, hilar, or axillary lymph nodes. Thyroid gland, trachea, and esophagus demonstrate no significant findings. Lungs/Pleura: Small bilateral pleural effusions are seen, larger on the right. Lungs demonstrate some emphysematous disease. Dependent atelectasis is worse on the left. No nodule, mass or consolidative process. Musculoskeletal: Scattered small sclerotic lesions are identified, most prominent in T1 and T2. There also scattered lucent lesions in the spine. There is edema in the left posterior paraspinous and rotator cuff muscle bellies. Edema is also seen in the left latissimus dorsi. CT ABDOMEN PELVIS FINDINGS Hepatobiliary: A 2.5 cm in diameter hypoattenuating lesion is seen in the right hepatic lobe on image 57. The lesion has overall density units of 18.3 and scattered small calcifications within it. A second hypoattenuating lesion in the left hepatic lobe measures 0.6 cm on image 58 and has density units 16.8. There is a third small lesion in the posterior right hepatic lobe on image 46. These cannot be definitively characterized. High attenuation within the gallbladder could be due to sludge or vicarious excretion of contrast. Biliary tree is unremarkable. Pancreas: Unremarkable. No pancreatic ductal dilatation or surrounding inflammatory changes. Spleen: Normal in size without focal abnormality.  Adrenals/Urinary Tract: The adrenal glands appear normal. 4 cm right renal cyst is noted. The patient has moderate to moderately severe bilateral hydroureteronephrosis. No stone is identified. The urinary bladder is decompressed with a Foley catheter in place.  Stomach/Bowel: Stomach is within normal limits. Small hiatal hernia noted. The appendix is not visualized. No evidence of bowel wall thickening, distention, or inflammatory changes. Vascular/Lymphatic: Extensive atherosclerosis.  No aneurysm. Reproductive: Marked prostatomegaly. Other: There is extensive body wall edema and a small volume of abdominal and pelvic ascites. Musculoskeletal: Multifocal muscle edema is present and worst in the right gluteal musculature, left obturator internus and externus and pectineus muscles. There is also edema in the proximal thigh muscles, much worse on the right. Multifocal areas of dystrophic calcification are present in muscle, worst in the thighs and right buttock. Bones demonstrate scattered punctate lesions. There is a mild superior endplate compression fracture T12. There also are multiple areas of bony lucency. The L1 vertebral body appears mildly expanded. IMPRESSION: Scattered sclerotic and likely lytic lesions throughout the thoracic and lumbar spine worrisome for neoplastic process. Primary lesion is not identified. MRI of the thoracic spine with and without contrast is recommended for further evaluation. 3 hypoattenuating lesions in the liver cannot be definitively characterized. MRI with and without contrast could be used for further evaluation. Based on imaging findings, MRI of the thoracic and lumbar spine is likely to be higher yield initially for cancer. Multifocal muscle edema and calcifications cannot be definitively characterized but are most worrisome for polymyositis. Consider consult with rheumatology and/or muscle biopsy. Moderate bilateral hydronephrosis is likely due to marked prostatomegaly.  Anasarca with small bilateral pleural effusions and small volume of abdominal and pelvic ascites. Aortic Atherosclerosis (ICD10-I70.0) and Emphysema (ICD10-J43.9). Electronically Signed   By: Inge Rise M.D.   On: 09/02/2019 15:29   CT ABDOMEN PELVIS W CONTRAST  Result Date: 09/11/2019 CLINICAL DATA:  Confusion and slurred speech. EXAM: CT CHEST, ABDOMEN, AND PELVIS WITH CONTRAST TECHNIQUE: Multidetector CT imaging of the chest, abdomen and pelvis was performed following the standard protocol during bolus administration of intravenous contrast. CONTRAST:  100 mL OMNIPAQUE IOHEXOL 300 MG/ML  SOLN COMPARISON:  None. FINDINGS: CT CHEST FINDINGS Cardiovascular: The patient is status post CABG with a pacing device in place. Heart size is normal. No pericardial effusion. Mediastinum/Nodes: No enlarged mediastinal, hilar, or axillary lymph nodes. Thyroid gland, trachea, and esophagus demonstrate no significant findings. Lungs/Pleura: Small bilateral pleural effusions are seen, larger on the right. Lungs demonstrate some emphysematous disease. Dependent atelectasis is worse on the left. No nodule, mass or consolidative process. Musculoskeletal: Scattered small sclerotic lesions are identified, most prominent in T1 and T2. There also scattered lucent lesions in the spine. There is edema in the left posterior paraspinous and rotator cuff muscle bellies. Edema is also seen in the left latissimus dorsi. CT ABDOMEN PELVIS FINDINGS Hepatobiliary: A 2.5 cm in diameter hypoattenuating lesion is seen in the right hepatic lobe on image 57. The lesion has overall density units of 18.3 and scattered small calcifications within it. A second hypoattenuating lesion in the left hepatic lobe measures 0.6 cm on image 58 and has density units 16.8. There is a third small lesion in the posterior right hepatic lobe on image 46. These cannot be definitively characterized. High attenuation within the gallbladder could be due to sludge  or vicarious excretion of contrast. Biliary tree is unremarkable. Pancreas: Unremarkable. No pancreatic ductal dilatation or surrounding inflammatory changes. Spleen: Normal in size without focal abnormality. Adrenals/Urinary Tract: The adrenal glands appear normal. 4 cm right renal cyst is noted. The patient has moderate to moderately severe bilateral hydroureteronephrosis. No stone is identified. The urinary bladder is decompressed with a Foley catheter in place. Stomach/Bowel:  Stomach is within normal limits. Small hiatal hernia noted. The appendix is not visualized. No evidence of bowel wall thickening, distention, or inflammatory changes. Vascular/Lymphatic: Extensive atherosclerosis.  No aneurysm. Reproductive: Marked prostatomegaly. Other: There is extensive body wall edema and a small volume of abdominal and pelvic ascites. Musculoskeletal: Multifocal muscle edema is present and worst in the right gluteal musculature, left obturator internus and externus and pectineus muscles. There is also edema in the proximal thigh muscles, much worse on the right. Multifocal areas of dystrophic calcification are present in muscle, worst in the thighs and right buttock. Bones demonstrate scattered punctate lesions. There is a mild superior endplate compression fracture T12. There also are multiple areas of bony lucency. The L1 vertebral body appears mildly expanded. IMPRESSION: Scattered sclerotic and likely lytic lesions throughout the thoracic and lumbar spine worrisome for neoplastic process. Primary lesion is not identified. MRI of the thoracic spine with and without contrast is recommended for further evaluation. 3 hypoattenuating lesions in the liver cannot be definitively characterized. MRI with and without contrast could be used for further evaluation. Based on imaging findings, MRI of the thoracic and lumbar spine is likely to be higher yield initially for cancer. Multifocal muscle edema and calcifications cannot  be definitively characterized but are most worrisome for polymyositis. Consider consult with rheumatology and/or muscle biopsy. Moderate bilateral hydronephrosis is likely due to marked prostatomegaly. Anasarca with small bilateral pleural effusions and small volume of abdominal and pelvic ascites. Aortic Atherosclerosis (ICD10-I70.0) and Emphysema (ICD10-J43.9). Electronically Signed   By: Inge Rise M.D.   On: 08/30/2019 15:29   DG CHEST PORT 1 VIEW  Result Date: 09/15/2019 CLINICAL DATA:  PICC line placement. EXAM: PORTABLE CHEST 1 VIEW COMPARISON:  Chest CT 09/03/2019. FINDINGS: 1230 hours. Right arm PICC extends to approximately the superior cavoatrial junction, although its tip is partially obscured by overlap with the patient's left subclavian AICD leads. The heart size and mediastinal contours are stable status post median sternotomy and mitral valve surgery. Left pleural effusion and left basilar atelectasis have not significantly changed. There is no pneumothorax or pulmonary edema. IMPRESSION: Right arm PICC extends to approximately the superior cavoatrial junction. Stable left pleural effusion and left basilar atelectasis. Electronically Signed   By: Richardean Sale M.D.   On: 09/15/2019 12:45   EEG adult  Result Date: 08/29/2019 Alexis Goodell, MD     09/19/2019  9:23 PM ELECTROENCEPHALOGRAM REPORT Patient: Sophia Braford       Room #: [id number] EEG No. ID: [id number] Age: 69 y.o.        Sex: male Requesting Physician: Heber Monroe Report Date:  09/10/2019       Interpreting Physician: Alexis Goodell History: Ottis Tellman is an 69 y.o. male with altered mental status Medications: Heparin, Pacerone, Lipitor, Invanz, Lopressor, Flomax Conditions of Recording:  This is a 21 channel routine scalp EEG performed with bipolar and monopolar montages arranged in accordance to the international 10/20 system of electrode placement. One channel was dedicated to EKG recording. The patient is in the awake and  drowsy states. Description:  Despite the state of the patient the background activity remains unchanged.  The background consists of a low voltage, symmetrical, fairly well organized, 6 Hz theta activity that is diffusely distributed.  No epileptiform activity is noted. Hyperventilation and intermittent photic stimulation were not performed. IMPRESSION: This is an abnormal EEG secondary to general background slowing.  This finding may be seen with a diffuse disturbance that is etiologically nonspecific, but may include  a metabolic encephalopathy, among other possibilities.  No epileptiform activity was noted.  Alexis Goodell, MD Neurology [phone] 09/22/2019, 9:19 PM   CT HEAD CODE STROKE WO CONTRAST  Result Date: 09/02/2019 CLINICAL DATA:  Code stroke.  Slurred speech and facial droop. EXAM: CT HEAD WITHOUT CONTRAST TECHNIQUE: Contiguous axial images were obtained from the base of the skull through the vertex without intravenous contrast. COMPARISON:  None. FINDINGS: Brain: Advanced atrophy. Extensive white matter disease bilaterally which is symmetric and most consistent with chronic microvascular ischemia. Negative for acute infarct, hemorrhage, mass. Vascular: Normal arterial flow voids. Skull: Negative Sinuses/Orbits: Paranasal sinuses clear.  Negative orbit. Other: None ASPECTS ([place] Stroke Program Early CT Score) - Ganglionic level infarction (caudate, lentiform nuclei, internal capsule, insula, M1-M3 cortex): 7 - Supraganglionic infarction (M4-M6 cortex): 3 Total score (0-10 with 10 being normal): 10 IMPRESSION: 1. No acute abnormality 2. ASPECTS is 10 3. Advanced atrophy and chronic microvascular ischemic change. 4. Preliminary results texted to [place] via [employer] Electronically Signed   By: Franchot Gallo M.D.   On: 09/07/2019 11:00    Labs:  CBC: Recent Labs    09/07/2019 1045 08/25/2019 1116 09/15/19 0300  WBC  --  8.2 6.5  HGB 9.9* 10.2* 8.7*  HCT 29.0* 33.3* 28.2*  PLT  --  233 193     COAGS: Recent Labs    09/12/2019 1116 09/15/19 0229  INR 1.8* 1.8*  APTT 35  --     BMP: Recent Labs    08/27/2019 1045 09/20/2019 1116 09/15/19 0229  NA 144 145 145  K 4.4 4.1 3.3*  CL 115* 115* 117*  CO2  --  21* 20*  GLUCOSE 113* 119* 74  BUN 26* 22 17  CALCIUM  --  7.8* 7.7*  CREATININE 0.40* 0.48* 0.49*  GFRNONAA  --  >60 NOT CALCULATED  GFRAA  --  >60 NOT CALCULATED    LIVER FUNCTION TESTS: Recent Labs    08/31/2019 1116 09/15/19 0229  BILITOT 0.7 0.5  AST 21 18  ALT 21 18  ALKPHOS 206* 171*  PROT 4.3* 4.1*  ALBUMIN 1.6* 1.6*    TUMOR MARKERS: No results for input(s): AFPTM, CEA, CA199, CHROMGRNA in the last 8760 hours.  Assessment and Plan:  68 y/o M with extensive medical history as per HPI who presented to the ED yesterday due to AMS and slurred speech concerning for stroke. A code stroke was called but CT head found no acute intracranial process. His family reported significant decline since November of 2020 after sustaining a fall with large RP hemorrhage and subsequent development of myositis ossificans - he has had a significant amount of weight loss (150 lbs >> 115 lbs) and has not been eating much at all. Imaging showed several concerns for metastatic disease of unknown primary including a left neck mass/lymphadenopathy - IR has been asked to biopsy this area to further direct care.  Plan to proceed with US guided biopsy on Monday (5/24) - patient to be NPO/hold tube feeding at midnight on 5/24, continue to hold warfarin, heparin gtt to be held ~3 hours prior to procedure (IR RN will call floor RN to coordinate timing of this). Discussed plan with patient's family at bedside today including his daughter/POA Jorge Kennedy - they are all in agreement with this plan.  Risks and benefits of left neck mass/lymphadenopathy biopsy was discussed with the patient and/or patient's family including, but not limited to bleeding, infection, damage to adjacent structures or  low yield requiring additional  tests.  All of the questions were answered and there is agreement to proceed.  Consent signed by Jorge Kennedy, and placed in chart.   Thank you for this interesting consult.  I greatly enjoyed meeting Artemis Carpenito and look forward to participating in their care.  A copy of this report was sent to the requesting provider on this date.  Electronically Signed: Joaquim Nam, PA-C 09/15/2019, 1:21 PM   I spent a total of 40 Minutes in face to face in clinical consultation, greater than 50% of which was counseling/coordinating care for left neck mass/lymphadenopathy biopsy.

## 2019-09-15 NOTE — Progress Notes (Signed)
[place] for heparin Indication: 5/20 Acute L subclavian DVT and hx mechanical mitral valve/Afib (PTA warfarin on hold)  No Known Allergies  Patient Measurements: Height: 5\' 8"  (172.7 cm) Weight: 52.2 kg (115 lb) IBW/kg (Calculated) : 68.4 Heparin Dosing Weight: 52.2 kg  Vital Signs: Temp: 98.3 F (36.8 C) (05/22 0834) Temp Source: Axillary (05/22 0834) BP: 126/76 (05/22 0834) Pulse Rate: 87 (05/22 0834)  Labs: . Recent Labs    09/10/2019 1045 08/31/2019 1045 09/13/2019 1116 09/15/19 0229 09/15/19 0300 09/15/19 1349  HGB 9.9*   < > 10.2*  --  8.7*  --   HCT 29.0*  --  33.3*  --  28.2*  --   PLT  --   --  233  --  193  --   APTT  --   --  35  --   --   --   LABPROT  --   --  20.3* 20.6*  --   --   INR  --   --  1.8* 1.8*  --   --   HEPARINUNFRC  --   --   --  0.18*  --  0.24*  CREATININE 0.40*  --  0.48* 0.49*  --   --    < > = values in this interval not displayed.     Estimated Creatinine Clearance: 63.4 mL/min (A) (by C-G formula based on SCr of 0.49 mg/dL (L)).   Medical History: Past Medical History:  Diagnosis Date  . Atrial fibrillation with RVR ([place])   . DVT (deep venous thrombosis) ([place])   . H/O mitral valve replacement with mechanical valve   . Pacemaker 2001  . [facility])     Assessment: 69 year old M on warfarin PTA for atrial fibrillation (CHADS2VASc = 3), mechanical mitral valve and L subclavian DVT seen on outside imaging April 2021 and is still present 09/13/19. Last dose of warfarin 09/13/19, admit INR 1.8, presumed goal is 2.5 -3.5 for mechanical MV. Of note, patient had severe RP bleed in Nov 2020 and has poor PO intake for several months with 35 lb weight loss. Pharmacy consulted to dose heparin while warfarin on hold.    Patient also has large ill-defined soft L subclavian node mass concerning for metastatic disease and likely needs a biopsy. 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 Also, may need to r/o metastatic meningeal spread with spinal tap.   Home warfarin regimen: 2.5mg  MWF, 5mg  TTSS  Heparin level 0.24 still subtherapeutic but up trending. Drawn 6 hrs after rate increase. Bolus dose was not given. Will target higher end of goal given high clot risk, as tolerated. Noted that patient has a low weight and BMI 17.    Goal of Therapy:  Heparin level 0.3-0.7 units/ml   Monitor platelets by anticoagulation protocol: Yes   Plan:  Increase heparin infusion to 1050 units/hr  F/u 8hr HL  Daily heparin level and CBC, s/sx of bleeding F/u plans for multiple potential procedures  F/u eventually resuming home warfarin vs [place], PharmD, BCPS, [place] Pharmacist  Please check AMION for all [place] phone numbers After 10:00 PM, call [place]

## 2019-09-15 NOTE — Progress Notes (Signed)
HD#1 Subjective:  Overnight Events: none  Jorge Kennedy is still disoriented. Doesn't want to be touched. Daughter thinks he is talking some better, but not really making sense.   Discussed plan to try to figure out plan for long-term nutrition. We are limited in work-up because of pacemaker not being compatible with MRI, but would like to try and get tissue sample of supraclavicular lymph node.     Objective:  Vital signs in last 24 hours: Vitals:   09/22/2019 1600 09/07/2019 2106 08/26/2019 2358 09/15/19 0352  BP: 115/80 116/74 118/69 116/70  Pulse: 87 93 98 82  Resp: 17 20 16 16   Temp:  97.9 F (36.6 C) 98.1 F (36.7 C) 98 F (36.7 C)  TempSrc:  Oral Oral Oral  SpO2: 96% 96% 97% 97%  Weight:      Height:       Supplemental O2: none SpO2: 97 %   Physical Exam:  Physical Exam  Constitutional: He appears cachectic. He does not have a sickly appearance. No distress.  Patient is alert and oriented to himself and his family but otherwise disoriented.  He appears older than stated age.  HENT:  Head: Atraumatic.  Neck:    Cardiovascular: An irregularly irregular rhythm present. Tachycardia present.  Pulmonary/Chest: Effort normal. No respiratory distress.  Abdominal: Soft. He exhibits no distension. There is no abdominal tenderness.  Suprapubic Foley catheter in place has not been changed in a week good urine output seen through the Foley catheter.  Musculoskeletal:        General: Tenderness (diffuse) present. No edema.     Cervical back: Normal range of motion.  Neurological: He is alert. He displays tremor.  Skin: Skin is warm and dry.    Filed Weights   09/10/2019 1118  Weight: 52.2 kg     Intake/Output Summary (Last 24 hours) at 09/15/2019 0559 Last data filed at 09/15/2019 0300 Gross per 24 hour  Intake 529.42 ml  Output --  Net 529.42 ml    Pertinent Labs: CBC Latest Ref Rng & Units 09/15/2019 09/13/2019 09/03/2019  WBC 4.0 - 10.5 K/uL 6.5 8.2 -  Hemoglobin  13.0 - 17.0 g/dL 8.7(L) 10.2(L) 9.9(L)  Hematocrit 39.0 - 52.0 % 28.2(L) 33.3(L) 29.0(L)  Platelets 150 - 400 K/uL 193 233 -    CMP Latest Ref Rng & Units 08/29/2019 09/05/2019  Glucose 70 - 99 mg/dL 119(H) 113(H)  BUN 8 - 23 mg/dL 22 26(H)  Creatinine 0.61 - 1.24 mg/dL 0.48(L) 0.40(L)  Sodium 135 - 145 mmol/L 145 144  Potassium 3.5 - 5.1 mmol/L 4.1 4.4  Chloride 98 - 111 mmol/L 115(H) 115(H)  CO2 22 - 32 mmol/L 21(L) -  Calcium 8.9 - 10.3 mg/dL 7.8(L) -  Total Protein 6.5 - 8.1 g/dL 4.3(L) -  Total Bilirubin 0.3 - 1.2 mg/dL 0.7 -  Alkaline Phos 38 - 126 U/L 206(H) -  AST 15 - 41 U/L 21 -  ALT 0 - 44 U/L 21 -      Pending Labs: CK, PSA, phosphate  Imaging: None  Assessment/Plan:   Principal Problem:   Protein calorie malnutrition (HCC) Active Problems:   Encephalopathy   CAD (coronary artery disease)   Patient Summary: Jorge Kennedy is a 69 y.o. with pertinent PMH of atrial fibrillation with RVR, CAD s/p CABG, sick sinus syndrome s/p pacemaker placement, mechanical mitral valve (on warfarin), history of myositis ossificans, who presented with subacute onset altered mental status and rapid decline in functional and cognitive  status and admit for subacute encephalopathy and concern for occult malignancy on hospital day 1  Subacute Encephalopathy  Evaluated by speech therapy and determined patient able to eat dysphagia 1 diet.  Continue to limit centrally acting medications when possible. - Continue work up for underlying etiology   Occult Malignancy  Patient has a left subclavian VTE in the setting of warfarin therapy.  Patient also has concern for occult malignancy in multiple areas based on CT scan findings showing scattered sclerotic and lytic lesions throughout the thoracic and lumbar spine and left supraclavicular lymphadenopathy.  Left supraclavicular  lymphadenopathy raise concern for gastric cancer vs prostate cancer in the setting of significantly enlarged  prostate. -Interventional radiology was consulted for biopsy -Switch to heparin for VTE treatment/as well as lower the INR for biopsy.   Protein caloric malnutrition: Normocytic anemia:  Patient has an albumin of 1.6 and hemoglobin 8.7 in the setting of poor p.o. intake.  Family states that the patient does not feel like he has an appetite and has a difficult time swallowing food. He was apparently about to have an upper endoscopy performed by GI  In [place] prior to coming here, but it was never performed.  -Speech therapy has been consulted for swallow evaluation - will state dysphagia 2 diet.   CAD s/p CABG and mechanical mitral valve HLD Records form previous hospitalization show echo evidence of mechanical valve. He was on warfarin for this  With a subtherapeutic INR on presentation.  - On Heparin - Continue statin medication   Sick sinus syndrome s/p pacemaker - Status post pacemaker placement - He will need cardiology follow up as his battery is running low.  Urinary tract infection: Hydronephrosis: Has suprapubic catheter placed at a previous hospitalization in [place].  Placed for unknown reasons.  Likely secondary to severe prostatomegaly.  CT scan imaging does show some hydronephrosis.  May need exchange of suprapubic catheter to ensure appropriate flow. Patient was discharged from hospital with acute Klebsiella cystitis of unknown severity and was given a 10 day course of ertapenem. - Finish ertapenem course - Re-culture urine - Flush catheter.  Atrial fibrillation: -Continue amiodarone 200 mg twice daily and metoprolol 25 mg twice daily -Anticoagulation switch from warfarin to heparin in the setting of VTE  Sacral decubitus ulcer: - Wound care consulted  Diet: Dysphagia 1 IVF: None,None VTE: Heparin Code: DNR PT/OT recs: Pending TOC recs: Pending   Dispo: Anticipated discharge pending further evaluation and management.    [name],  D.O. [facility]IM[facility], PGY-1 Date 09/15/2019 Time 5:59 AM

## 2019-09-15 NOTE — Evaluation (Signed)
Clinical/Bedside Swallow Evaluation Patient Details  Name: Jorge Kennedy MRN: [id number] Date of Birth: [birth year]  Today's Date: 09/15/2019 Time: SLP Start Time (ACUTE ONLY): 0807 SLP Stop Time (ACUTE ONLY): 0843 SLP Time Calculation (min) (ACUTE ONLY): 36 min  Past Medical History:  Past Medical History:  Diagnosis Date  . Atrial fibrillation with RVR ([place])   . DVT (deep venous thrombosis) ([place])   . H/O mitral valve replacement with mechanical valve   . Pacemaker 2001  . PE (pulmonary thromboembolism) ([place])    Past Surgical History:  Past Surgical History:  Procedure Laterality Date  . MITRAL VALVE REPLACEMENT  2001   HPI:  Jorge Kennedy is a 69 year old male with a pertinent past medical history of A. fib with RVR, CABG status post pacemaker, mechanical mitral valve disease warfarin), myositis ossificans, CAD, who presented to [facility] with AMS.  [place] off a ladder back in November, had significant internal bleeding at that time, has not been the same since. Has not been eating for quite some time. Tried to have feeding tube placed on Sunday secondary to poor PO intake; however, daughter reported that the surgeon was unable to place a G-tube secondary to stomach inflammation and poor expansion. Per daughter report, pt underwent a FEES yesterday at [facility] and they reported that he is at risk for aspiration with all consistencies and they recommended 1/2 tsp of puree and tsp of nectar-thick liquid with alternative means of nutrition. Daughter mentions that he has had no appetite.    Assessment / Plan / Recommendation Clinical Impression  Pt was seen for a bedside swallow evaluation and he presents with suspected oropharyngeal dysphagia with a possible esophageal component.  Per daughter report, pt underwent a FEES at an outside hospital yesterday and the SLP reported that the pt was at risk for aspiration with all consistencies, but they recommended 1/2 tsp of puree and tsp of nectar-thick  liquid as the safest PO options.  The SLP also reportedly recommended alternative means of nutrition.  FEES report was not available to view on Care Everywhere (E[place]).   Family reported concerns for acid reflux secondary to belching with all PO intake as of recently.  Oral mechanism exam was unremarkable.  Pt denied pain, but he was only able to tolerate the [facility] being elevated to 45 degrees with reverse Trendelenburg.  He consumed trials of thin liquid, nectar-thick liquid, puree, and regular solids.  Suspect delayed swallow initiation with all trials and multiple swallow per bolus were observed with thin liquid and puree boluses (possible indicative of pharyngeal residue or esophogeal dysfunction).  Eructation was also observed following thin liquid trials.  Mastication of regular solids was timely, but pt required a liquid wash to help initiate a swallow and then exhibited a delayed. prolonged cough.  Cough was noted to be weak.  No overt s/sx of aspiration were observed with puree or nectar-thick liquid trials.   Per reported FEES results from yesterday, recommend small bites of puree and tsp of nectar-thick liquid with medications administered crushed in puree and strict adherence to compensatory strategies.  MD may consider adding a PPI to address reflux concerns.  Will plan for a MBS today or tomorrow (as radiology schedule allows) to further evaluate swallow function.  SLP will f/u per POC.    SLP Visit Diagnosis: Dysphagia, oropharyngeal phase (R13.12)    Aspiration Risk  Moderate aspiration risk;Severe aspiration risk    Diet Recommendation Dysphagia 1 (Puree);Nectar-thick liquid   Liquid Administration via: Spoon Medication  Administration: Crushed with puree Supervision: Full supervision/cueing for compensatory strategies;Staff to assist with self feeding Compensations: Minimize environmental distractions;Slow rate;Small sips/bites Postural Changes: Seated upright at 90 degrees;Remain upright  for at least 30 minutes after po intake    Other  Recommendations Recommended Consults: Consider GI evaluation Oral Care Recommendations: Staff/trained caregiver to provide oral care;Oral care BID Other Recommendations: Order thickener from pharmacy;Prohibited food (jello, ice cream, thin soups);Remove water pitcher   Follow up Recommendations 24 hour supervision/assistance;Skilled Nursing facility      Frequency and Duration min 2x/week  2 weeks       Prognosis Prognosis for Safe Diet Advancement: Fair Barriers to Reach Goals: Cognitive deficits      Swallow Study   General HPI: Jorge Kennedy is a 69 year old male with a pertinent past medical history of A. fib with RVR, CABG status post pacemaker, mechanical mitral valve disease warfarin), myositis ossificans, CAD, who presented to [facility] with AMS.  [place] off a ladder back in November, had significant internal bleeding at that time, has not been the same since. Has not been eating for quite some time. Tried to have feeding tube placed on Sunday secondary to poor PO intake; however, daughter reported that the surgeon was unable to place a G-tube secondary to stomach inflamation and poor expansion. Per daughter report, pt underwent a FEES yesterday at [facility] and they reported that he is at risk for aspiration with all consistencies and they recommended 1/2 tsp of puree and tsp of nectar-thick liquid with alternative means of nutrition. Daughter mentions that he has had no appetite.  Type of Study: Bedside Swallow Evaluation Previous Swallow Assessment: FEES at outside hospital 09/10/2019 (reccords not available)  Diet Prior to this Study: NPO Temperature Spikes Noted: No Respiratory Status: Room air History of Recent Intubation: No Behavior/Cognition: Alert;Confused Oral Cavity Assessment: Within Functional Limits Oral Care Completed by SLP: No Oral Cavity - Dentition: Adequate natural dentition(Possible top dentures- pt unable to  verify ) Vision: Functional for self-feeding Self-Feeding Abilities: Needs assist Patient Positioning: Upright in bed Baseline Vocal Quality: Low vocal intensity Volitional Cough: Weak Volitional Swallow: Unable to elicit    Oral/Motor/Sensory Function Overall Oral Motor/Sensory Function: Within functional limits   Ice Chips Ice chips: Within functional limits Presentation: Spoon   Thin Liquid Thin Liquid: Impaired Presentation: Straw;Spoon Oral Phase Functional Implications: Left anterior spillage;Prolonged oral transit Pharyngeal  Phase Impairments: Multiple swallows;Suspected delayed Swallow    Nectar Thick Nectar Thick Liquid: Impaired Presentation: Spoon Pharyngeal Phase Impairments: Suspected delayed Swallow;Multiple swallows   Honey Thick Honey Thick Liquid: Not tested   Puree Puree: Impaired Presentation: Spoon Oral Phase Functional Implications: Prolonged oral transit Pharyngeal Phase Impairments: Multiple swallows   Solid     Solid: Impaired Presentation: Spoon Oral Phase Functional Implications: Oral residue Pharyngeal Phase Impairments: Cough - Delayed     Colin Mulders M.S., CCC-SLP Acute Rehabilitation Services Office: [phone]  Elvia Collum Claudean Leavelle 09/15/2019,9:11 AM

## 2019-09-15 NOTE — Progress Notes (Signed)
Brief chart review note  EEG unremarkable for seizures.  Shows generalized slowing and encephalopathy. Patient is anticoagulated.  I would defer the decision for an LP for cytology to the primary team. I would first get a definitive answer on the primary and get an oncological evaluation for the cancer and metastases prior to obtaining an LP   No need for antiepileptics  Please call neurology as needed.  -- Amie Portland, MD [facility] Pager: [phone]

## 2019-09-15 NOTE — Progress Notes (Signed)
[place] for heparin Indication: 5/20 Acute L subclavian DVT and hx mechanical mitral valve/Afib (PTA warfarin on hold)  No Known Allergies  Patient Measurements: Height: 5\' 8"  (172.7 cm) Weight: 52.2 kg (115 lb) IBW/kg (Calculated) : 68.4 Heparin Dosing Weight: 52.2 kg  Vital Signs: Temp: 98.2 F (36.8 C) (05/22 2112) Temp Source: Oral (05/22 2112) BP: 117/85 (05/22 2112) Pulse Rate: 81 (05/22 2112)  Labs: . Recent Labs    09/08/2019 1045 09/11/2019 1045 09/03/2019 1116 09/15/19 0229 09/15/19 0300 09/15/19 1349  HGB 9.9*   < > 10.2*  --  8.7*  --   HCT 29.0*  --  33.3*  --  28.2*  --   PLT  --   --  233  --  193  --   APTT  --   --  35  --   --   --   LABPROT  --   --  20.3* 20.6*  --   --   INR  --   --  1.8* 1.8*  --   --   HEPARINUNFRC  --   --   --  0.18*  --  0.24*  CREATININE 0.40*  --  0.48* 0.49*  --   --    < > = values in this interval not displayed.     Estimated Creatinine Clearance: 63.4 mL/min (A) (by C-G formula based on SCr of 0.49 mg/dL (L)).   Medical History: Past Medical History:  Diagnosis Date  . Atrial fibrillation with RVR ([place])   . DVT (deep venous thrombosis) ([place])   . H/O mitral valve replacement with mechanical valve   . Pacemaker 2001  . PE (pulmonary throm[facility]embolism) [facility])     Assessment: 68 year old M on warfarin PTA for atrial fibrillation (CHADS2VASc = 3), mechanical mitral valve and L subclavian DVT seen on outside imaging April 2021 and is still present 09/13/19. Last dose of warfarin 09/13/19, admit INR 1.8, presumed goal is 2.5 -3.5 for mechanical MV. Of note, patient had severe RP bleed in Nov 2020 and has poor PO intake for several months with 35 lb weight loss. Pharmacy consulted to dose heparin while warfarin on hold.    Patient also has large ill-defined soft L subclavian node mass concerning for metastatic disease and likely needs a biopsy. 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 Also, may need to r/o metastatic meningeal spread with spinal tap.   Home warfarin regimen: 2.5mg  MWF, 5mg  TTSS  Heparin level came back therapeutic at 0.33, on 1050 units/hr. Hgb down to 8.7 this morning, plt 193. No s/sx of bleeding or infusion.    Goal of Therapy:  Heparin level 0.3-0.7 units/ml   Monitor platelets by anticoagulation protocol: Yes   Plan:  Increase heparin infusion to 1100 units/hr to keep in goal range F/u 8hr HL with AM labs Daily heparin level and CBC, s/sx of bleeding F/u plans for multiple potential procedures  F/u eventually resuming home warfarin vs [place], PharmD, BCCCP Clinical Pharmacist  09/15/2019 11:39 PM  Please check AMION for all [place] phone numbers After 10:00 PM, call [place] [phone]

## 2019-09-15 NOTE — Progress Notes (Addendum)
ANTICOAGULATION CONSULT NOTE  Pharmacy Consult for heparin Indication: hx VTE/mechanical mitral valve/Afib - warfarin on hold  No Known Allergies  Patient Measurements: Height: 5\' 8"  (172.7 cm) Weight: 52.2 kg (115 lb) IBW/kg (Calculated) : 68.4 Heparin Dosing Weight: 52.2 kg  Vital Signs: Temp: 98 F (36.7 C) (05/22 0352) Temp Source: Oral (05/22 0352) BP: 116/70 (05/22 0352) Pulse Rate: 82 (05/22 0352)  Labs: Recent Labs    08/25/2019 1045 09/11/2019 1045 09/22/2019 1116 09/15/19 0229 09/15/19 0300  HGB 9.9*   < > 10.2*  --  8.7*  HCT 29.0*  --  33.3*  --  28.2*  PLT  --   --  233  --  193  APTT  --   --  35  --   --   LABPROT  --   --  20.3* 20.6*  --   INR  --   --  1.8* 1.8*  --   HEPARINUNFRC  --   --   --  0.18*  --   CREATININE 0.40*  --  0.48* 0.49*  --    < > = values in this interval not displayed.    Estimated Creatinine Clearance: 63.4 mL/min (A) (by C-G formula based on SCr of 0.49 mg/dL (L)).   Medical History: Past Medical History:  Diagnosis Date  . Atrial fibrillation with RVR ([place])   . DVT (deep venous thrombosis) ([place])   . H/O mitral valve replacement with mechanical valve   . Pacemaker 2001  . PE (pulmonary thromboembolism) [facility])     Assessment: 69 year old male presenting with slurred speech and confusion. Pharmacy consulted to dose heparin while warfarin on hold. Patient on warfarin outpatient for atrial fibrillation/mechanical mitral valve. INR on admission is 1.8, last dose of warfarin 09/13/19. CT showing L subclavian vein thrombosis and large ill-defined soft tissue mass concerning for metastatic disease.   Home warfarin regimen: 5mg  on Tuesday, Thursday, Saturday and Sunday.  Heparin level came back subtherapeutic at 0.18, on 850 units/hr. Hgb down today from 10.2 to 8.7, plt 193. No s/sx of bleeding or infusion issues.   Goal of Therapy:  Heparin level 0.3-0.7 units/ml Monitor platelets by anticoagulation protocol: Yes   Plan:   Increase heparin infusion to 950 units/hr  Order heparin level in 6-8 hours  Daily heparin level and CBC, s/sx of bleeding Follow-up plans to resume home warfarin  Antonietta Jewel, PharmD, BCCCP Clinical Pharmacist  09/15/2019 6:50 AM  Please check AMION for all [place] phone numbers After 10:00 PM, call [place] [phone]

## 2019-09-16 ENCOUNTER — Inpatient Hospital Stay (HOSPITAL_COMMUNITY): Payer: Medicare HMO

## 2019-09-16 LAB — CBC
HCT: 31.8 % — ABNORMAL LOW (ref 39.0–52.0)
Hemoglobin: 9.7 g/dL — ABNORMAL LOW (ref 13.0–17.0)
MCH: 27.6 pg (ref 26.0–34.0)
MCHC: 30.5 g/dL (ref 30.0–36.0)
MCV: 90.3 fL (ref 80.0–100.0)
Platelets: 254 10*3/uL (ref 150–400)
RBC: 3.52 MIL/uL — ABNORMAL LOW (ref 4.22–5.81)
RDW: 22.4 % — ABNORMAL HIGH (ref 11.5–15.5)
WBC: 9 10*3/uL (ref 4.0–10.5)
nRBC: 0 % (ref 0.0–0.2)

## 2019-09-16 LAB — BASIC METABOLIC PANEL
Anion gap: 3 — ABNORMAL LOW (ref 5–15)
BUN: 17 mg/dL (ref 8–23)
CO2: 19 mmol/L — ABNORMAL LOW (ref 22–32)
Calcium: 7.4 mg/dL — ABNORMAL LOW (ref 8.9–10.3)
Chloride: 123 mmol/L — ABNORMAL HIGH (ref 98–111)
Creatinine, Ser: 0.5 mg/dL — ABNORMAL LOW (ref 0.61–1.24)
GFR calc Af Amer: >60 mL/min (ref >60)
GFR calc non Af Amer: >60 mL/min (ref >60)
Glucose, Bld: 112 mg/dL — ABNORMAL HIGH (ref 70–99)
Potassium: 3.8 mmol/L (ref 3.5–5.1)
Sodium: 145 mmol/L (ref 135–145)

## 2019-09-16 LAB — MAGNESIUM: Magnesium: 2.1 mg/dL (ref 1.7–2.4)

## 2019-09-16 LAB — HEPARIN LEVEL (UNFRACTIONATED): Heparin Unfractionated: 0.3 IU/mL (ref 0.30–0.70)

## 2019-09-16 MED ORDER — FLUCONAZOLE IN SODIUM CHLORIDE 200-0.9 MG/100ML-% IV SOLN
200.0000 mg | INTRAVENOUS | Status: DC
  Administered 2019-09-16 – 2019-09-19 (×4): 200 mg via INTRAVENOUS
  Filled 2019-09-16 (×5): qty 100

## 2019-09-16 MED ORDER — COLLAGENASE 250 UNIT/GM EX OINT
TOPICAL_OINTMENT | Freq: Every day | CUTANEOUS | Status: DC
  Filled 2019-09-16 (×2): qty 30

## 2019-09-16 NOTE — Progress Notes (Signed)
[facility] for heparin Indication: 5/20 Acute L subclavian DVT and hx mechanical mitral valve/Afib (PTA warfarin on hold)  No Known Allergies  Patient Measurements: Height: 5\' 8"  (172.7 cm) Weight: 52.2 kg (115 lb) IBW/kg (Calculated) : 68.4 Heparin Dosing Weight: 52.2 kg  Vital Signs: Temp: 98.1 F (36.7 C) (05/23 0442) Temp Source: Oral (05/23 0442) BP: 105/59 (05/23 0442) Pulse Rate: 80 (05/23 0442)  Labs: . Recent Labs    08/31/2019 1045 09/16/2019 1045 09/02/2019 1116 09/15/19 0229 09/15/19 0300 09/15/19 1349  HGB 9.9*   < > 10.2*  --  8.7*  --   HCT 29.0*  --  33.3*  --  28.2*  --   PLT  --   --  233  --  193  --   APTT  --   --  35  --   --   --   LABPROT  --   --  20.3* 20.6*  --   --   INR  --   --  1.8* 1.8*  --   --   HEPARINUNFRC  --   --   --  0.18*  --  0.24*  CREATININE 0.40*  --  0.48* 0.49*  --   --    < > = values in this interval not displayed.     Estimated Creatinine Clearance: 63.4 mL/min (A) (by C-G formula based on SCr of 0.5 mg/dL (L)).   Medical History: Past Medical History:  Diagnosis Date  . Atrial fibrillation with RVR ([place])   . DVT (deep venous thrombosis) ([place])   . H/O mitral valve replacement with mechanical valve   . Pacemaker 2001  . PE (pulmonary thromboembolism) [facility])     Assessment: 69 year old M on warfarin PTA for atrial fibrillation (CHADS2VASc = 3), mechanical mitral valve and L subclavian DVT seen on outside imaging April 2021 and is still present 09/13/19. Last dose of warfarin 09/13/19, admit INR 1.8, presumed goal is 2.5 -3.5 for mechanical MV. Of note, patient had severe RP bleed in Nov 2020 and has poor PO intake for several months with 35 lb weight loss. Pharmacy consulted to dose heparin while warfarin on hold.    Patient also has large ill-defined soft L subclavian node mass concerning for metastatic disease and likely needs a biopsy. 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 Also, may need to r/o metastatic meningeal spread with spinal tap.   Home warfarin regimen: 2.5mg  MWF, 5mg  TTSS  Heparin level just therapeutic at 0.30 despite rate increase. Would target middle to higher end of goal range given active clot and high clot risk. H/H trended back up and is stable overall.   Goal of Therapy:  Heparin level 0.3-0.7 units/ml   Monitor platelets by anticoagulation protocol: Yes   Plan:  Increase heparin infusion to 1150 units/hr   Daily heparin level and CBC, s/sx of bleeding Planned 5/24 IR biopsy, heparin to be held 3hr before procedure F/u plans for other potential procedures F/u eventually resuming home warfarin vs [place], PharmD, BCPS, [place] Pharmacist  Please check AMION for all [place] phone numbers After 10:00 PM, call [place]

## 2019-09-16 NOTE — Progress Notes (Signed)
Subjective: Nursing reports no issues overnight. Remains afebrile. There was no family at bedside today. Jorge Kennedy was sleeping on my evaluation and did not arouse to voice or soft touch. Appeared comfortable.   Objective:  Vital signs in last 24 hours: Vitals:   09/15/19 1634 09/15/19 2112 09/15/19 2329 09/16/19 0442  BP: 92/60 117/85 (!) 131/113 (!) 105/59  Pulse: 71 81 (!) 104 80  Resp: 14 16 20 16   Temp: 97.9 F (36.6 C) 98.2 F (36.8 C) 98.1 F (36.7 C) 98.1 F (36.7 C)  TempSrc: Oral Oral Oral Oral  SpO2: 99% 96% 97% 98%  Weight:      Height:       General: chronically ill, frail gentleman, sleeping comfortably in bed CV: normal rate, irregularly irregular rhythm Pulm: normal work of breathing; lungs CTA in anterior fields   Assessment/Plan:  Principal Problem:   Protein calorie malnutrition (HCC) Active Problems:   Encephalopathy   CAD (coronary artery disease)   Myositis   DVT of axillary vein, chronic left (HCC)   History of pulmonary embolism   History of mitral valve replacement with mechanical valve   Aortic atherosclerosis (HCC)   Suprapubic catheter (HCC)   Bilateral hydronephrosis   Metastatic malignant neoplasm ([place])   Neck mass  Jorge Kennedy is a 69 y.o. with pertinent PMH of atrial fibrillation with RVR, CAD s/p CABG, sick sinus syndrome s/p pacemaker placement, mechanical mitral valve (on warfarin), history of myositis ossificans, who presented with subacute onset altered mental status and rapid decline in functional and cognitive status.   Subacute Encephalopathy  Declining functional status -symptomatically unchanged -initial work-up negative for intracranial bleed, infection or electrolyte derangements to explain his symptoms -we are unable to obtain MRI due to his pacemaker; I suspect he may have underlying metastasis based on the rest of his imaging (see below) -continue delirium precautions and avoiding any centrally acting meds    Evaluated by speech therapy and determined patient able to eat dysphagia 1 diet.  Continue to limit centrally acting medications when possible. - Continue work up for underlying etiology   Occult Malignancy Subclavian VTE  -patient has had significant weight loss and functional decline over the 6 months -CT scan findings showing scattered sclerotic and lytic lesions throughout lumbar and thoracic spine, as well as a left supraclavicular lymph node  -he also has a left subclavian VTE in the setting of warfarin therapy, indicating a hypercoagulable state which would point towards malignancy  -we appreciate IR consult who plans to perform biopsy of supraclavicular LN on 5/24 to diagnose the underlying primary -initially suspected prostate, but his PSA was surprisingly normal; GI would seem to be next most likely -continue heparin drip to treat VTE   Severe Protein calorie malnutrition: Patient has an albumin of 1.6. Family states that the patient does not feel like he has an appetite and has a difficult time swallowing food. He was apparently about to have an upper endoscopy performed by GI  In [place] prior to coming here, but it was never performed.  -this is most likely in the setting of advanced underlying malignancy  -appreciate SLP eval; recommended dysphagia 1 diet -will consult cortrak team tomorrow for feeding tube placement for nutritional needs   CAD s/p CABG and mechanical mitral valve on chronic warfarin HLD - currently on Heparin for active VTE - Continue statin medication   Sick sinus syndrome s/p pacemaker -Status post pacemaker placement - He will need cardiology follow up as  his battery is running low.  Urinary tract infection: Hydronephrosis: -currently has suprapubic catheter which was placed at a previous hospitalization in [place]; bilateral hydronephrosis likely chronic for bladder outlet obstruction 2/2 prostamegaly -he is on ertapenem x10  days for ESBL UTI  Atrial fibrillation: -Continue amiodarone 200 mg twice daily and metoprolol 25 mg twice daily  -Anticoagulation switch from warfarin to heparin in the setting of VTE  Sacral decubitus ulcer: - Wound care consulted  Prior to Admission Living Arrangement: SNF Anticipated Discharge Location: SNF Barriers to Discharge: continued medial work-up    Delice Bison, DO 09/16/2019, 7:36 AM

## 2019-09-16 NOTE — Consult Note (Signed)
WOC Nurse Consult Note: Reason for Consult: Sacral Unstageable Pressure Injury, full thickness, bone palpable. Patient's daughter is in the room and reports that her father's wound was debrided last Sunday (09/09/19) in [place]. Wound type: Pressure Pressure Injury POA: Yes Measurement:8.2cm x 7.5cm x 1.5cm at most distal portion. Wound bed: Wound bed is 70% black, 20% yellow and 10% red Drainage (amount, consistency, odor) scant serosanguinous on old dressing today Periwound: intact with erythema and scattered satellite leisons consistent with fungal overgrowth Dressing procedure/placement/frequency: Patient is on a mattress replacement with low air loss feature. He is incontinence of stool, becomes rigid when turning and repositioning. Heels are intact. I will provide pressure redistribution heel boots for prevention of pressure injury at the heel.  Turning and repositioning is still essential despite being on a mattress replacement. Keep HOB at or below a 30 degree angle. The protein deficiencies are noted and managed by hospitalist service/SLP.   Topical wound care: I will provide Nursing with guidance for the care of the sacral wound using an enzymatic debriding agent (collagenase). Additionally, I will ask PT for assessment and provision of hydrotherapy/pulsatile lavage with serial debridements.    Recommend consultation with CCS for collaboration on this POC and for any other suggestions. They will also determine if surgical debridement/surgical intervention is needed.  [place] nursing team will see once weekly with hydrotherapy. We will remain available to this patient, the nursing and medical teams.   Thanks, Maudie Flakes, MSN, RN, [place], Arther Abbott  Pager# [phone]

## 2019-09-16 NOTE — Progress Notes (Signed)
Modified Barium Swallow Progress Note  Patient Details  Name: Jorge Kennedy MRN: [id number] Date of Birth: 04-Feb-1951  Today's Date: 09/16/2019  Modified Barium Swallow completed.  Full report located under Chart Review in the Imaging Section.  Brief recommendations include the following:  Clinical Impression   Pt's swallow response was delayed to the level of the pyriforms.  Airway protection was adequate with nectar thick liquids via teaspoon and straw as well as purees.  Pt had trace aspiration of thin liquids via teaspoon due to decreased airway protection resulting from limited anterior laryngeal movement and he was unable to clear solids from his oral cavity due to dry mouth and weakened mastication.  Pt also had decreased clearance of boluses through the UES and his daughter relays a somewhat prolonged history of complaints consistent with esophageal dysphagia, including early satiety and belching throughout meals.  Esophageal sweep revealed stasis in the lower 1/3 of esophagus.  Pt would benefit from an assessment of esophageal function. Recommend that pt be advanced to nectar thick liquids via straw to facilitate improved hydration and energy conservation with continuation of pureed solids.  Pt may be a good candidate for the water protocol with teaspoons of thin liquids in between meals to continue working towards liquids progression.     Swallow Evaluation Recommendations   Recommended Consults: Consider esophageal assessment   SLP Diet Recommendations: Dysphagia 1 (Puree) solids;Nectar thick liquid   Liquid Administration via: Straw   Medication Administration: Crushed with puree   Supervision: Full supervision/cueing for compensatory strategies;Staff to assist with self feeding   Compensations: Minimize environmental distractions;Slow rate;Small sips/bites   Postural Changes: Remain semi-upright after after feeds/meals (Comment);Seated upright at 90 degrees   Oral Care  Recommendations: Oral care BID   Other Recommendations: Order thickener from pharmacy;Prohibited food (jello, ice cream, thin soups);Remove water pitcher    Jorge Kennedy, [name] 09/16/2019,2:19 PM

## 2019-09-16 NOTE — Progress Notes (Signed)
MD notified of pt vitals and MD ordered to continue to monitor pt. Delia Heady RN   09/16/19 1247  Vitals  Temp 99.5 F (37.5 C)  Temp Source Oral  BP (!) 84/31  MAP (mmHg) (!) 47  BP Location Right Leg  BP Method Automatic  Patient Position (if appropriate) Lying  Pulse Rate 66  Pulse Rate Source Monitor  Resp 20  Oxygen Therapy  SpO2 96 %  O2 Device Room Air  Pain Assessment  Pain Scale 0-10  Pain Score 0  MEWS Score  MEWS Temp 0  MEWS Systolic 1  MEWS Pulse 0  MEWS RR 0  MEWS LOC 0  MEWS Score 1  MEWS Score Color Green

## 2019-09-17 ENCOUNTER — Other Ambulatory Visit: Payer: Self-pay

## 2019-09-17 ENCOUNTER — Inpatient Hospital Stay (HOSPITAL_COMMUNITY): Payer: Medicare HMO

## 2019-09-17 DIAGNOSIS — E43 Unspecified severe protein-calorie malnutrition: Secondary | ICD-10-CM

## 2019-09-17 DIAGNOSIS — R1319 Other dysphagia: Secondary | ICD-10-CM

## 2019-09-17 DIAGNOSIS — L8990 Pressure ulcer of unspecified site, unspecified stage: Secondary | ICD-10-CM | POA: Diagnosis present

## 2019-09-17 LAB — CBC
HCT: 30.9 % — ABNORMAL LOW (ref 39.0–52.0)
Hemoglobin: 9.3 g/dL — ABNORMAL LOW (ref 13.0–17.0)
MCH: 27.4 pg (ref 26.0–34.0)
MCHC: 30.1 g/dL (ref 30.0–36.0)
MCV: 90.9 fL (ref 80.0–100.0)
Platelets: 236 10*3/uL (ref 150–400)
RBC: 3.4 MIL/uL — ABNORMAL LOW (ref 4.22–5.81)
RDW: 22.5 % — ABNORMAL HIGH (ref 11.5–15.5)
WBC: 8.5 10*3/uL (ref 4.0–10.5)
nRBC: 0 % (ref 0.0–0.2)

## 2019-09-17 LAB — BASIC METABOLIC PANEL
Anion gap: 6 (ref 5–15)
BUN: 11 mg/dL (ref 8–23)
CO2: 20 mmol/L — ABNORMAL LOW (ref 22–32)
Calcium: 7.6 mg/dL — ABNORMAL LOW (ref 8.9–10.3)
Chloride: 123 mmol/L — ABNORMAL HIGH (ref 98–111)
Creatinine, Ser: 0.51 mg/dL — ABNORMAL LOW (ref 0.61–1.24)
GFR calc Af Amer: >60 mL/min (ref >60)
GFR calc non Af Amer: >60 mL/min (ref >60)
Glucose, Bld: 100 mg/dL — ABNORMAL HIGH (ref 70–99)
Potassium: 3.5 mmol/L (ref 3.5–5.1)
Sodium: 149 mmol/L — ABNORMAL HIGH (ref 135–145)

## 2019-09-17 LAB — HEPARIN LEVEL (UNFRACTIONATED): Heparin Unfractionated: 0.32 IU/mL (ref 0.30–0.70)

## 2019-09-17 LAB — PHOSPHORUS: Phosphorus: 2.5 mg/dL (ref 2.5–4.6)

## 2019-09-17 LAB — PROTIME-INR
INR: 2.5 — ABNORMAL HIGH (ref 0.8–1.2)
Prothrombin Time: 26.2 seconds — ABNORMAL HIGH (ref 11.4–15.2)

## 2019-09-17 LAB — PATHOLOGIST SMEAR REVIEW

## 2019-09-17 LAB — MAGNESIUM: Magnesium: 2.1 mg/dL (ref 1.7–2.4)

## 2019-09-17 MED ORDER — MIDAZOLAM HCL 2 MG/2ML IJ SOLN
INTRAMUSCULAR | Status: AC
  Filled 2019-09-17: qty 2

## 2019-09-17 MED ORDER — LACTATED RINGERS IV SOLN: INTRAVENOUS | Status: DC

## 2019-09-17 MED ORDER — HEPARIN (PORCINE) 25000 UT/250ML-% IV SOLN
1200.0000 [IU]/h | INTRAVENOUS | Status: AC
  Administered 2019-09-17: 1200 [IU]/h via INTRAVENOUS

## 2019-09-17 MED ORDER — LIDOCAINE HCL (PF) 1 % IJ SOLN
INTRAMUSCULAR | Status: AC
  Filled 2019-09-17: qty 30

## 2019-09-17 MED ORDER — FENTANYL CITRATE (PF) 100 MCG/2ML IJ SOLN
INTRAMUSCULAR | Status: AC
  Filled 2019-09-17: qty 2

## 2019-09-17 MED ORDER — FENTANYL CITRATE (PF) 100 MCG/2ML IJ SOLN
INTRAMUSCULAR | Status: AC | PRN
  Administered 2019-09-17: 12.5 ug via INTRAVENOUS

## 2019-09-17 NOTE — Progress Notes (Signed)
Physical Therapy Wound Treatment Patient Details  Name: Jorge Kennedy MRN: [id number] Date of Birth: 1951-03-24  Today's Date: 09/17/2019 Time: 1122-1214 Time Calculation (min): 52 min  Subjective  Subjective: Pt s/p biopsy, daughter Dorian Pod in room, explained procedure to pt and daughter. Daughter requests to see wound when undressed. PT indicated where eschar was and purpose of hydrotherapy and collagenase treatment.  Patient and Family Stated Goals: improved wound healing and increased protein intake Prior Treatments: Santyl and dressing changes  Pain Score: Pain Score: Pt premedicated prior hydrotherapy, however exhibits grimace 6/10 on facial pain scale.  Wound Assessment  Pressure Injury 09/15/19 Sacrum Posterior Stage 3 -  Full thickness tissue loss. Subcutaneous fat may be visible but bone, tendon or muscle are NOT exposed. sarum pressure ulcer from facility (Active)  Dressing Type ABD;Barrier Film (skin prep);Gauze (Comment);Moist to moist;Other (Comment) 09/17/19 1215  Dressing Changed;Clean;Dry;Intact 09/17/19 1215  Dressing Change Frequency Daily 09/17/19 1215  State of Healing Eschar 09/17/19 1215  Site / Wound Assessment Brown;Black;Yellow;Red;Pink;Painful;Granulation tissue 09/17/19 1215  % Wound base Red or Granulating 15% 09/17/19 1215  % Wound base Yellow/Fibrinous Exudate 45% 09/17/19 1215  % Wound base Black/Eschar 40% 09/17/19 1215  Peri-wound Assessment Maceration;Erythema (blanchable);Edema;Pink 09/17/19 1215  Wound Length (cm) 10.6 cm 09/17/19 1215  Wound Width (cm) 8.2 cm 09/17/19 1215  Wound Depth (cm) 1.5 cm 09/17/19 1215  Wound Surface Area (cm^2) 86.92 cm^2 09/17/19 1215  Wound Volume (cm^3) 130.38 cm^3 09/17/19 1215  Undermining (cm) 1 cm 11 o'clock to 4 o'clock 09/17/19 1215  Margins Unattached edges (unapproximated) 09/17/19 1215  Drainage Amount Moderate 09/17/19 1215  Drainage Description Serosanguineous 09/17/19 1215  Treatment Debridement  (Selective);Hydrotherapy (Pulse lavage);Other (Comment);Packing (Saline gauze) 09/17/19 1215   Santyl on necrotic tissue   Incision (Closed) 09/17/19 Neck Left (Active)  Dressing Type Other (Comment) 09/17/19 1215  Dressing Intact;Dry;Clean 09/17/19 1115  Dressing Change Frequency Daily 09/17/19 1055  Site / Wound Assessment Dressing in place / Unable to assess 09/17/19 1115  Drainage Amount None 09/17/19 1115   Hydrotherapy Pulsed lavage therapy - wound location: sacrum Pulsed Lavage with Suction (psi): 12 psi Pulsed Lavage with Suction - Normal Saline Used: 1000 mL Pulsed Lavage Tip: Tip with splash shield Selective Debridement Selective Debridement - Location: sacrum Selective Debridement - Tools Used: Forceps;Scalpel Selective Debridement - Tissue Removed: softened black eschar and yellow stringy slough   Wound Assessment and Plan  Wound Therapy - Assess/Plan/Recommendations Wound Therapy - Clinical Statement: Pt had just returned from biopsy and was premedicated prior to treatment. Although pt responded with yes and no questions, did not appear to understand treatment plan fully, daughter Dorian Pod was present visualized the wound and in agreement to treatment plan. Pt wound is full thickness and superior aspect with hard brown eschar, inferior aspect of wound covered in closely adhered yellow slough. Pt tolerated hydrotherapy and able to debride softened eschar. Pt will continue to benefit from hydrotherapy and selective debridement to decreased bioburden to promote increased healing.  Wound Therapy - Functional Problem List: decreased mobility  Factors Delaying/Impairing Wound Healing: Multiple medical problems;Immobility;Other (comment)(protein malnutrition ) Hydrotherapy Plan: Debridement;Pulsatile lavage with suction;Patient/family education;Dressing change Wound Therapy - Frequency: 6X / week Wound Therapy - Follow Up Recommendations: Skilled nursing facility Wound Plan: see  above  Wound Therapy Goals- Improve the function of patient's integumentary system by progressing the wound(s) through the phases of wound healing (inflammation - proliferation - remodeling) by: Decrease Necrotic Tissue to: 50 Increase Granulation Tissue to: 50 Goals/treatment plan/discharge plan  were made with and agreed upon by patient/family: Yes Time For Goal Achievement: 7 days Wound Therapy - Potential for Goals: Fair  Goals will be updated until maximal potential achieved or discharge criteria met.  Discharge criteria: when goals achieved, discharge from hospital, MD decision/surgical intervention, no progress towards goals, refusal/missing three consecutive treatments without notification or medical reason.  GP    Dani Gobble. Migdalia Dk PT, DPT Acute Rehabilitation Services Pager [phone] Office [phone]  [place] 09/17/2019, 1:19 PM

## 2019-09-17 NOTE — Progress Notes (Signed)
[facility] for heparin Indication: 5/20 Acute L subclavian DVT and hx mechanical mitral valve/Afib (PTA warfarin on hold)  No Known Allergies  Patient Measurements: Height: 5\' 8"  (172.7 cm) Weight: 52.2 kg (115 lb) IBW/kg (Calculated) : 68.4 Heparin Dosing Weight: 52.2 kg  Vital Signs: Temp: (P) 98.7 F (37.1 C) (05/24 1055) Temp Source: (P) Oral (05/24 1055) BP: 102/47 (05/24 1055) Pulse Rate: 79 (05/24 1055)  Labs: . Recent Labs    09/20/2019 1045 09/17/2019 1045 09/17/2019 1116 09/15/19 0229 09/15/19 0300 09/15/19 1349  HGB 9.9*   < > 10.2*  --  8.7*  --   HCT 29.0*  --  33.3*  --  28.2*  --   PLT  --   --  233  --  193  --   APTT  --   --  35  --   --   --   LABPROT  --   --  20.3* 20.6*  --   --   INR  --   --  1.8* 1.8*  --   --   HEPARINUNFRC  --   --   --  0.18*  --  0.24*  CREATININE 0.40*  --  0.48* 0.49*  --   --    < > = values in this interval not displayed.     Estimated Creatinine Clearance: 63.4 mL/min (A) (by C-G formula based on SCr of 0.51 mg/dL (L)).   Medical History: Past Medical History:  Diagnosis Date  . Atrial fibrillation with RVR ([place])   . DVT (deep venous thrombosis) ([place])   . H/O mitral valve replacement with mechanical valve   . Pacemaker 2001  . PE (pulmonary thromboembolism) [facility])     Assessment: 69 year old M on warfarin PTA for atrial fibrillation (CHADS2VASc = 3), mechanical mitral valve and L subclavian DVT seen on outside imaging April 2021 and is still present 09/13/19. Last dose of warfarin 09/13/19, admit INR 1.8, presumed goal is 2.5 -3.5 for mechanical MV. Of note, patient had severe RP bleed in Nov 2020 and has poor PO intake for several months with 35 lb weight loss. Pharmacy consulted to dose heparin while warfarin on hold.    Patient also has large ill-defined soft L subclavian node mass concerning for metastatic disease and likely needs a biopsy. 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 Also, may need to r/o metastatic meningeal spread with spinal tap.   Home warfarin regimen: 2.5mg  MWF, 5mg  TTSS  S/p biopsy today - > to resume heparin at 3 pm  Goal of Therapy:  Heparin level 0.3-0.7 units/ml   Monitor platelets by anticoagulation protocol: Yes   Plan:  Heparin at 1200 units / hr restarting at 3 pm today F/u eventually resuming home warfarin vs DOAC Daily heparin level, CBC  Thank you Anette Guarneri, PharmD   Please check AMION for all [place] phone numbers After 10:00 PM, call [place]

## 2019-09-17 NOTE — Procedures (Signed)
Interventional Radiology Procedure Note  Procedure: US guided biopsy of left supraclavicular infiltrative mass.  Mx 16g core. .  Complications: None Recommendations:  - Ok to shower tomorrow - Do not submerge for 7 days - Routine wound care   Signed,  Dulcy Fanny. Earleen Newport, DO

## 2019-09-17 NOTE — Progress Notes (Signed)
SLP Cancellation Note  Patient Details Name: Jorge Kennedy MRN: [id number] DOB: 1950/12/01   Cancelled treatment:        Attempted to see x2 today - pt with other providers during both attempts. Will continue efforts.  Amia Rynders L. Tivis Ringer, [place] CCC/SLP Acute Rehabilitation Services Office number [phone] Pager [phone]    [name] [place] 09/17/2019, 2:54 PM

## 2019-09-17 NOTE — Progress Notes (Signed)
HD#3 Subjective:  Overnight Events: no overnight events.  Shaheer Kinas is resting comfortably in bed. He denies new symptoms at this time.   Objective:  Vital signs in last 24 hours: Vitals:   09/16/19 1633 09/16/19 2040 09/17/19 0005 09/17/19 0349  BP: (!) 82/42 (!) 86/54 (!) 106/59 100/70  Pulse: 65 77 89 84  Resp: 20 20 20 20   Temp: 98.2 F (36.8 C) 98.6 F (37 C) 99.3 F (37.4 C) 98.5 F (36.9 C)  TempSrc: Oral Oral Oral Oral  SpO2: 94% 93% 93% 93%  Weight:      Height:       Supplemental O2: RA SpO2: 93 %  Anti-infectives (From admission, onward)   Start     Dose/Rate Route Frequency Ordered Stop   09/16/19 1600  fluconazole (DIFLUCAN) IVPB 200 mg     200 mg 100 mL/hr over 60 Minutes Intravenous Every 24 hours 09/16/19 1556     09/10/2019 1715  ertapenem (INVANZ) 1,000 mg in sodium chloride 0.9 % 100 mL IVPB     1 g 200 mL/hr over 30 Minutes Intravenous Every 24 hours 08/25/2019 1710        Physical Exam:  Physical Exam  Constitutional: He appears malnourished. He appears cachectic. No distress.  Alert and oriented to self only. He appears older than stated age.   HENT:  Head: Normocephalic and atraumatic.  Eyes: EOM are normal.  Cardiovascular: Normal rate and intact distal pulses.  Murmur heard. Pulmonary/Chest: Effort normal. No respiratory distress.  Abdominal: Soft. He exhibits no distension.  Musculoskeletal:        General: Edema (left arm) present. Normal range of motion.     Cervical back: Normal range of motion.  Neurological: He is alert.  Generalized weakness, no focal deficits  Skin: Skin is warm and dry.    Filed Weights   09/06/2019 1118  Weight: 52.2 kg    Intake/Output Summary (Last 24 hours) at 09/17/2019 [id number] Last data filed at 09/16/2019 2345 Gross per 24 hour  Intake 2422.23 ml  Output 500 ml  Net 1922.23 ml   Net IO Since Admission: 3,630.89 mL [09/17/19 0619]  Pertinent Labs: CBC Latest Ref Rng & Units 09/17/2019  09/16/2019 09/15/2019  WBC 4.0 - 10.5 K/uL 8.5 9.0 6.5  Hemoglobin 13.0 - 17.0 g/dL 9.3(L) 9.7(L) 8.7(L)  Hematocrit 39.0 - 52.0 % 30.9(L) 31.8(L) 28.2(L)  Platelets 150 - 400 K/uL 236 254 193    CMP Latest Ref Rng & Units 09/16/2019 09/15/2019 09/09/2019  Glucose 70 - 99 mg/dL 112(H) 74 119(H)  BUN 8 - 23 mg/dL 17 17 22   Creatinine 0.61 - 1.24 mg/dL 0.50(L) 0.49(L) 0.48(L)  Sodium 135 - 145 mmol/L 145 145 145  Potassium 3.5 - 5.1 mmol/L 3.8 3.3(L) 4.1  Chloride 98 - 111 mmol/L 123(H) 117(H) 115(H)  CO2 22 - 32 mmol/L 19(L) 20(L) 21(L)  Calcium 8.9 - 10.3 mg/dL 7.4(L) 7.7(L) 7.8(L)  Total Protein 6.5 - 8.1 g/dL - 4.1(L) 4.3(L)  Total Bilirubin 0.3 - 1.2 mg/dL - 0.5 0.7  Alkaline Phos 38 - 126 U/L - 171(H) 206(H)  AST 15 - 41 U/L - 18 21  ALT 0 - 44 U/L - 18 21    Pending Labs: No pending labs  Imaging: No new imaging    Assessment/Plan:   Principal Problem:   Protein calorie malnutrition (HCC) Active Problems:   Encephalopathy   CAD (coronary artery disease)   Myositis   DVT of axillary vein, chronic  left [facility])   History of pulmonary embolism   History of mitral valve replacement with mechanical valve   Aortic atherosclerosis (HCC)   Suprapubic catheter (HCC)   Bilateral hydronephrosis   Metastatic malignant neoplasm (HCC)   Neck mass   Pressure injury of skin    Patient Summary: Aldrin Hammeris a 69 y.o.with pertinent PMH of atrial fibrillation with RVR, CAD s/p CABG, sick sinus syndrome s/p pacemaker placement, mechanical mitral valve (on warfarin), history of myositis ossificans,who presented withsubacute onset altered mental status and rapid decline in functional and cognitive status.  Subacute Encephalopathy Declining functional status -Likely metastatic disease to his brain is the major concern -Continue delirium precautions and avoid central acting medications when possible   Occult Malignancy Subclavian VTE -40 pound weight loss over 6 months  concern for occult malignancy possibly prostate although PSA was normal or GI (esophagus versus gastric) -CT scan findings of sclerotic and lytic lesions throughout the lumbar and thoracic spine and supraclavicular lymph node enlarged on the left side -IR performed left supraclavicular lymph node biopsy today. -Gastroenterology consulted for upper endoscopy due to concern for occult malignancy.  We appreciate their recommendations and assistance. -continue heparin drip to treat VTE   Severe Protein calorie malnutrition: -Speech evaluation shows esophageal dysphagia requiring dysphagia 1 diet.  He still does not seem to be getting enough nutrition. -Patient previously had an attempt for G-tube at his last hospitalization in [place] per general surgery.  Family states that they were not able to insufflate the stomach. -GI consulted for upper endoscopy in setting of concern for occult malignancy. -IR will need to be consulted for possible G-tube placement.  CAD s/p CABGand mechanical mitral valve on chronic warfarin HLD - currently on Heparin for active VTE - Continue statin medication  Sick sinus syndromes/p pacemaker -Status post pacemaker placement - He will need cardiology follow up as his battery is running low. -Pacemaker interrogated on admission.  Urinary tract infection: Hydronephrosis: -currently has suprapubic catheter which was placed at a previous hospitalization in [place]; bilateral hydronephrosis likely chronic for bladder outlet obstruction 2/2 prostamegaly -he is on ertapenem x10 days for ESBL UTI - On day 3/10 of Ertapenem  Atrial fibrillation: -Continue amiodarone 200 mg twice daily and metoprolol 25 mg twice daily  -Anticoagulation switch from warfarin to heparin in the setting of VTE  Sacral decubitus ulcer: - Wound care consulted  Diet: Tube Feeds IVF: LR, VTE: Heparin Code: Full PT/OT recs: Pending TOC recs: pending    Dispo:  Anticipated discharge pending .    [name], D.O. [facility], PGY-1 Date 09/17/2019 Time 6:19 AM Pager: [phone]

## 2019-09-17 NOTE — Progress Notes (Signed)
Pt transported off unit to IR for procedure. Delia Heady RN

## 2019-09-17 NOTE — Progress Notes (Signed)
PT Cancellation Note  Patient Details Name: Jorge Kennedy MRN: [id number] DOB: 1951-02-12   Cancelled Treatment:    Reason Eval/Treat Not Completed: (P) Patient at procedure or test/unavailable Pt is off floor for a biopsy. PT will follow back when pt is back from IR.   Elizabeth B. Migdalia Dk PT, DPT Acute Rehabilitation Services Pager [phone] Office [phone]   [place] 09/17/2019, 9:08 AM

## 2019-09-17 NOTE — Consult Note (Addendum)
[facility] Consult: 11:30 AM 09/17/2019  LOS: 3 days   Referring Provider: Dr [name] from [facility].  Primary Care Physician:  System, Pcp Not In Primary Gastroenterologist: unassigned    Reason for Consultation: Dysphagia, weight loss.   HPI: Jorge Kennedy is a 69 y.o. male.  PMH.  Chronic Coumadin.  A. fib.  S/p mechanical MVR.  S/p CABG.  Left subclavian DVT, PE present prior to this admission.  Myositis ossificans.  Bilateral hydronephrosis s/p suprapubic catheter.  [place] off ladder in 02/2019 and sustained "significant internal bleeding".  Doing poorly since then.   All previous medical care was in [place] where he was living with a daughter.  Admission 4/25 - 09/11/19.  Had evaluations of various medical problems at [facility] in [facility] (no records in [place] search for [facility] in [place]).  Another daughter, Lean Valliere is healthcare POA and she had him transferred to [facility] in [place]. Patient's had dysphagia and weight loss "for some time". Initially burping w meals starting Thanksgiving 2020 progressed to choking, signif dimished po intake and wt loss by March 2021.  GI said too high risk for outpt EGD and once admitted, EGD never pursued.   FEES w reported risk for aspiration of all consistencies,  recommended 1/2 tsp of puree and tsp of nectar-thick liquid with alternative means of nutrition. Surgeon was unable to place a G-tube secondary to "stomach inflammation and poor expansion" .   Patient was transferred to [facility] hospital for evaluation of AMS and general medical decline.  08/24/2019 CT chest, ab, pelvis showed right-sided pulmonary emboli, progressive calcifications in right hip, thigh, paraspinal musculature but no clear malignancy. 08/19/2019 head CT revealed  non-specific gas within left masticator space tracking along the left temporalis muscle 08/19/2019 LUE Doppler showed DVT of left internal jugular vein and axillary vein, SVT of left cephalic and basilic veins [id number] CT neck: L subclavian thrombosis which extends to lower left jugular vein.  Ill-defined, large mass in the left upper back extending to the lower neck and left axilla, margins ill-defined and stranding into adjacent fat noted.  There appears to be hemorrhage within the Mast.  Suspect malignancy/metastatic disease with hemorrhage.  Left supraclavicular mass consistent with malignant adenopathy.  Multiple sclerotic skeletal lesions suggestive of metastatic prostate cancer.  Now s/p biopsy of left neck node  .   Patient initially thought to have prostate cancer but his PSA is just 3.4  SLP bedside swallow eval 5/23, see below under heading of radiology. Currently warfarin on hold, heparin gtt in place.   Hgb stable w range 9.3 - 10.7.   Albumin low at 1.6.    Past Medical History:  Diagnosis Date  . Atrial fibrillation with RVR ([place])   . DVT (deep venous thrombosis) ([place])   . H/O mitral valve replacement with mechanical valve   . Pacemaker 2001  . PE (pulmonary thromboembolism) ([place])     Past Surgical History:  Procedure Laterality Date  . MITRAL VALVE REPLACEMENT  2001    Prior  to Admission medications   Medication Sig Start Date End Date Taking? Authorizing Provider  acetaminophen (TYLENOL) 325 MG tablet Take 650 mg by mouth every 6 (six) hours as needed for moderate pain.   Yes [provider]  amiodarone (PACERONE) 200 MG tablet Take 200 mg by mouth 2 (two) times daily.   Yes [provider]  atorvastatin (LIPITOR) 80 MG tablet Take 80 mg by mouth at bedtime.   Yes [provider]  doxepin (SINEQUAN) 25 MG capsule Take 25 mg by mouth daily.   Yes [provider]  dronabinol (MARINOL) 2.5 MG capsule Take 2.5 mg by mouth 2 (two) times  daily before a meal.   Yes [provider]  enoxaparin (LOVENOX) 80 MG/0.8ML injection Inject 80 mg into the skin in the morning and at bedtime.   Yes [provider]  ERTAPENEM SODIUM IV Inject 1 each into the vein daily.   Yes [provider]  FLUoxetine (PROZAC) 10 MG capsule Take 10 mg by mouth daily.   Yes [provider]  Heparin Lock Flush (HEPARIN FLUSH) 10 UNIT/ML SOLN injection Inject 5 mLs into the vein in the morning and at bedtime.   Yes [provider]  melatonin 5 MG TABS Take 10 mg by mouth daily.   Yes [provider]  metoprolol tartrate (LOPRESSOR) 25 MG tablet Take 25 mg by mouth 2 (two) times daily.   Yes [provider]  mirtazapine (REMERON) 15 MG tablet Take 15 mg by mouth at bedtime.   Yes [provider]  nicotine (NICODERM CQ - DOSED IN MG/24 HOURS) 21 mg/24hr patch Place 21 mg onto the skin daily.   Yes [provider]  sennosides-docusate sodium (SENOKOT-S) 8.6-50 MG tablet Take 1 tablet by mouth daily as needed for constipation.   Yes [provider]  sodium chloride 0.9 % infusion Inject 10 mLs into the vein in the morning and at bedtime.   Yes [provider]  sucralfate (CARAFATE) 1 g tablet Take 1 g by mouth 2 (two) times daily.   Yes [provider]  tamsulosin (FLOMAX) 0.4 MG CAPS capsule Take 0.4 mg by mouth daily.   Yes [provider]  warfarin (COUMADIN) 2.5 MG tablet Take 2.5 mg by mouth every Monday, Wednesday, and Friday.   Yes [provider]  warfarin (COUMADIN) 5 MG tablet Take 5 mg by mouth See admin instructions. Takes 5 mg by mouth daily every Tuesday,Thursday,Saturday and Sunday.   Yes [provider]    Scheduled Meds: . amiodarone  200 mg Oral BID  . atorvastatin  80 mg Oral Daily  . Chlorhexidine Gluconate Cloth  6 each Topical Daily  . collagenase   Topical Daily  . fentaNYL      . FLUoxetine  10 mg Oral  Daily  . lidocaine (PF)      . melatonin  10 mg Oral Daily  . metoprolol tartrate  25 mg Oral BID  . nicotine  21 mg Transdermal Daily  . sodium chloride flush  10-40 mL Intracatheter Q12H  . tamsulosin  0.4 mg Oral Daily   Infusions: . ertapenem Stopped (09/16/19 1723)  . fluconazole (DIFLUCAN) IV Stopped (09/16/19 1755)  . heparin    . lactated ringers Stopped (09/17/19 1109)  . thiamine injection 500 mg (09/17/19 1109)   PRN Meds: Gerhardt's butt cream, HYDROmorphone (DILAUDID) injection, Resource ThickenUp Clear, senna-docusate, sodium chloride flush   Allergies as of 08/29/2019  . (No Known Allergies)  No family history on file.  Social History   Socioeconomic History  . Marital status: Widowed    Spouse name: Not on file  . Number of children: Not on file  . Years of education: Not on file  . Highest education level: Not on file  Occupational History  . Not on file  Tobacco Use  . Smoking status: Not on file  Substance and Sexual Activity  . Alcohol use: Not on file  . Drug use: Not on file  . Sexual activity: Not on file  Other Topics Concern  . Not on file  Social History Narrative  . Not on file   Social Determinants of Health   Financial Resource Strain:   . Difficulty of Paying Living Expenses:   Food Insecurity:   . Worried About Charity fundraiser in the Last Year:   . Arboriculturist in the Last Year:   Transportation Needs:   . Film/video editor (Medical):   Marland Kitchen Lack of Transportation (Non-Medical):   Physical Activity:   . Days of Exercise per Week:   . Minutes of Exercise per Session:   Stress:   . Feeling of Stress :   Social Connections:   . Frequency of Communication with Friends and Family:   . Frequency of Social Gatherings with Friends and Family:   . Attends Religious Services:   . Active Member of Clubs or Organizations:   . Attends Archivist Meetings:   Marland Kitchen Marital Status:   Intimate Partner Violence:   .  Fear of Current or Ex-Partner:   . Emotionally Abused:   Marland Kitchen Physically Abused:   . Sexually Abused:     REVIEW OF SYSTEMS: Constitutional: Weakness, failure to thrive.  Appears to be essentially bedridden. ENT:  No nose bleeds Pulm: Some cough, no dyspnea CV:  No palpitations, no LE edema.  No angina suprapubic Clapacs cath is in place GU:  No hematuria, no frequency GI: See HPI.  No abdominal pain, no neck pain Heme: Purpura tendency.  No excessive bleeding. Transfusions: None per records, patient cannot tell me whether he is received blood products before Neuro:  No headaches, no peripheral tingling or numbness Derm:  No itching, no rash or sores.  Endocrine:  No sweats or chills.  No polyuria or dysuria Immunization: Not queried Travel:  None beyond local counties in last few months.    PHYSICAL EXAM: Vital signs in last 24 hours: Vitals:   09/17/19 1055 09/17/19 1116  BP: (!) 102/47 (!) 99/42  Pulse: 79 74  Resp: 20   Temp: 98.7 F (37.1 C)   SpO2: 94% 97%   Wt Readings from Last 3 Encounters:  09/17/2019 52.2 kg    General: Cachectic, ill looking WM.  Is not able to provide any history, says very little Head: No facial asymmetry or swelling.  No signs of head trauma. Eyes: No icterus, no conjunctival pallor Ears: Does not appear to have hearing loss Nose: No discharge Mouth: Oral mucosa somewhat dry, clear.  Poor dentition.  Tongue midline Neck: Fullness without discrete mass in left subclavian/clavicular region. Lungs: Diminished but clear. Heart: RRR.  No MRG.  S1, S2.  Occasional skipped beats. Abdomen: Thin, soft, nontender, nondistended.  Suprapubic catheter in place..   Rectal: Deferred.  There is a large full-thickness sacral decubitus present. Musc/Skeltl: Muscle wasting of all limbs. Extremities: No CCE Neurologic: Follows commands.  Essentially nonverbal. Skin: Sallow  Psych: Flat affect.  Calm.  Intake/Output  from previous day: 05/23 0701 - 05/24  0700 In: 1359.4 [I.V.:1059.6; IV Piggyback:299.8] Out: 500 [Urine:500] Intake/Output this shift: Total I/O In: 1285.3 [I.V.:1235.3; IV Piggyback:50] Out: -   LAB RESULTS: Recent Labs    09/15/19 0300 09/16/19 0429 09/17/19 0458  WBC 6.5 9.0 8.5  HGB 8.7* 9.7* 9.3*  HCT 28.2* 31.8* 30.9*  PLT 193 254 236   BMET Lab Results  Component Value Date   NA 149 (H) 09/17/2019   NA 145 09/16/2019   NA 145 09/15/2019   K 3.5 09/17/2019   K 3.8 09/16/2019   K 3.3 (L) 09/15/2019   CL 123 (H) 09/17/2019   CL 123 (H) 09/16/2019   CL 117 (H) 09/15/2019   CO2 20 (L) 09/17/2019   CO2 19 (L) 09/16/2019   CO2 20 (L) 09/15/2019   GLUCOSE 100 (H) 09/17/2019   GLUCOSE 112 (H) 09/16/2019   GLUCOSE 74 09/15/2019   BUN 11 09/17/2019   BUN 17 09/16/2019   BUN 17 09/15/2019   CREATININE 0.51 (L) 09/17/2019   CREATININE 0.50 (L) 09/16/2019   CREATININE 0.49 (L) 09/15/2019   CALCIUM 7.6 (L) 09/17/2019   CALCIUM 7.4 (L) 09/16/2019   CALCIUM 7.7 (L) 09/15/2019   LFT Recent Labs    09/15/19 0229  PROT 4.1*  ALBUMIN 1.6*  AST 18  ALT 18  ALKPHOS 171*  BILITOT 0.5   PT/INR Lab Results  Component Value Date   INR 1.8 (H) 09/15/2019   INR 1.8 (H) 09/24/2019   Hepatitis Panel No results for input(s): HEPBSAG, HCVAB, HEPAIGM, HEPBIGM in the last 72 hours. C-Diff No components found for: CDIFF Lipase  No results found for: LIPASE  Drugs of Abuse     Component Value Date/Time   LABOPIA NONE DETECTED 08/30/2019 1152   COCAINSCRNUR NONE DETECTED 09/19/2019 1152   LABBENZ NONE DETECTED 09/01/2019 1152   AMPHETMU NONE DETECTED 08/25/2019 1152   THCU NONE DETECTED 09/08/2019 1152   LABBARB NONE DETECTED 09/02/2019 1152     RADIOLOGY STUDIES: DG CHEST PORT 1 VIEW  Result Date: 09/15/2019 CLINICAL DATA:  PICC line placement. EXAM: PORTABLE CHEST 1 VIEW COMPARISON:  Chest CT 09/07/2019. FINDINGS: 1230 hours. Right arm PICC extends to approximately the superior cavoatrial junction,  although its tip is partially obscured by overlap with the patient's left subclavian AICD leads. The heart size and mediastinal contours are stable status post median sternotomy and mitral valve surgery. Left pleural effusion and left basilar atelectasis have not significantly changed. There is no pneumothorax or pulmonary edema. IMPRESSION: Right arm PICC extends to approximately the superior cavoatrial junction. Stable left pleural effusion and left basilar atelectasis. Electronically Signed   By: Richardean Sale M.D.   On: 09/15/2019 12:45   DG Swallowing Func-Speech Pathology  Result Date: 09/16/2019 Bedside Swallow Evaluation     CLINICAL IMPRESSIONS Pt's swallow response was delayed to the level of the pyriforms.  Airway protection was adequate with nectar thick liquids via teaspoon and straw as well as purees.  Pt had trace aspiration of thin liquids via teaspoon due to decreased airway protection resulting from limited anterior laryngeal movement and he was unable to clear solids from his oral cavity due to dry mouth and weakened mastication.  Pt also had decreased clearance of boluses through the UES and his daughter relays a somewhat prolonged history of complaints consistent with esophageal dysphagia, including early satiety and belching throughout meals.  Esophageal sweep revealed stasis in the lower 1/3 of esophagus.  Pt would  benefit from an assessment of esophageal function. Recommend that pt be advanced to nectar thick liquids via straw to facilitate improved hydration and energy conservation with continuation of pureed solids.  Pt may be a good candidate for the water protocol with teaspoons of thin liquids in between meals to continue working towards liquids progression. Diagnosis Dysphagia, pharyngoesophageal phase (R13.14);Dysphagia, oropharyngeal phase (R13.12) Attention and concentration deficit following -- Frontal lobe and executive function deficit following -- Impact on safety and  function Moderate aspiration risk.   DIET RECOMMENDATION 09/16/2019 SLP Diet Recommendations Dysphagia 1 (Puree) solids;Nectar thick liquid Liquid Administration via Straw Medication Administration Crushed with puree Compensations Minimize environmental distractions;Slow rate;Small sips/bites Postural Changes Remain semi-upright after after feeds/meals (Comment);Seated upright at 90 degrees Recommended Consults Consider esophageal assessment.  Page, Selinda Orion 09/16/2019, 2:16 PM                IMPRESSION:   *   Dysphagia, weight loss.  Unsuccessful attempt to place G-tube at hospital in [place].  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  Suspicious for malignancy.  Nodal biopsy this morning, await pathology.  This certainly may be the cause for his swallowing issues.  *   Acute left subclavian DVT  *    Atrial fibrillation, mechanical MVR.  DVT, PE.  Chronic Coumadin, currently on hold with heparin drip in place.. INR 1.8 at arrival  *   Protein calorie malnutrition.  *    Normocytic anemia.  *    TSH 8.3.  No free T4 assay.     PLAN:     *   EGD likely.  Dr. Rush Landmark will see pt and make final decision. Has slot for EGD tmrw at 1015 AM.  Stop Heparin at [id number] tmrw.  I discussed the possibility of this with his daughter, Macallan Peregrino (cell [phone]), she is agreeable to proceed.    Azucena Freed  09/17/2019, 11:30 AM Phone [phone]

## 2019-09-17 NOTE — Progress Notes (Signed)
Pt heparin drip stopped as ordered by IR via telephone. Pt daughter at bedside updated. Delia Heady RN

## 2019-09-17 NOTE — Progress Notes (Signed)
Pt back to room from IR procedure. VSS, left biopsy site clean dry and intact with with no active bleeding or drainage noted. Band aid to site unremarkable, will continue to closely per order and policy.  MD team at bedside. Pt alert and verbally responsive with family at bedside. Will continue to closely monitor pt. Francis Gaines Sita Mangen RN   09/17/19 1055  Vitals  Temp 98.7 F (37.1 C)  Temp Source Oral  BP (!) 102/47  MAP (mmHg) (!) 63  BP Location Left Leg  BP Method Automatic  Patient Position (if appropriate) Lying  Pulse Rate 79  Pulse Rate Source Monitor  Resp 20  Level of Consciousness  Level of Consciousness Alert  Oxygen Therapy  SpO2 94 %  O2 Device Room Air

## 2019-09-18 ENCOUNTER — Encounter (HOSPITAL_COMMUNITY): Payer: Self-pay | Admitting: Internal Medicine

## 2019-09-18 ENCOUNTER — Encounter (HOSPITAL_COMMUNITY): Payer: Self-pay | Admitting: Certified Registered Nurse Anesthetist

## 2019-09-18 DIAGNOSIS — E43 Unspecified severe protein-calorie malnutrition: Secondary | ICD-10-CM | POA: Diagnosis present

## 2019-09-18 LAB — BASIC METABOLIC PANEL
Anion gap: 10 (ref 5–15)
BUN: 10 mg/dL (ref 8–23)
CO2: 17 mmol/L — ABNORMAL LOW (ref 22–32)
Calcium: 7.5 mg/dL — ABNORMAL LOW (ref 8.9–10.3)
Chloride: 121 mmol/L — ABNORMAL HIGH (ref 98–111)
Creatinine, Ser: 0.4 mg/dL — ABNORMAL LOW (ref 0.61–1.24)
GFR calc Af Amer: >60 mL/min (ref >60)
GFR calc non Af Amer: >60 mL/min (ref >60)
Glucose, Bld: 91 mg/dL (ref 70–99)
Potassium: 3.4 mmol/L — ABNORMAL LOW (ref 3.5–5.1)
Sodium: 148 mmol/L — ABNORMAL HIGH (ref 135–145)

## 2019-09-18 LAB — C-REACTIVE PROTEIN: CRP: 3.2 mg/dL — ABNORMAL HIGH (ref <1.0)

## 2019-09-18 LAB — CBC
HCT: 31.2 % — ABNORMAL LOW (ref 39.0–52.0)
Hemoglobin: 9.4 g/dL — ABNORMAL LOW (ref 13.0–17.0)
MCH: 27.6 pg (ref 26.0–34.0)
MCHC: 30.1 g/dL (ref 30.0–36.0)
MCV: 91.8 fL (ref 80.0–100.0)
Platelets: 226 10*3/uL (ref 150–400)
RBC: 3.4 MIL/uL — ABNORMAL LOW (ref 4.22–5.81)
RDW: 22.7 % — ABNORMAL HIGH (ref 11.5–15.5)
WBC: 8.6 10*3/uL (ref 4.0–10.5)
nRBC: 0 % (ref 0.0–0.2)

## 2019-09-18 LAB — PROTIME-INR
INR: 2.6 — ABNORMAL HIGH (ref 0.8–1.2)
Prothrombin Time: 27.3 seconds — ABNORMAL HIGH (ref 11.4–15.2)

## 2019-09-18 LAB — SEDIMENTATION RATE: Sed Rate: 6 mm/hr (ref 0–16)

## 2019-09-18 LAB — VITAMIN B1: Vitamin B1 (Thiamine): 99 nmol/L (ref 66.5–200.0)

## 2019-09-18 LAB — GLUCOSE, CAPILLARY
Glucose-Capillary: 94 mg/dL (ref 70–99)
Glucose-Capillary: 85 mg/dL (ref 70–99)
Glucose-Capillary: 130 mg/dL — ABNORMAL HIGH (ref 70–99)

## 2019-09-18 LAB — CK: Total CK: 30 U/L — ABNORMAL LOW (ref 49–397)

## 2019-09-18 LAB — HEPARIN LEVEL (UNFRACTIONATED): Heparin Unfractionated: 0.25 IU/mL — ABNORMAL LOW (ref 0.30–0.70)

## 2019-09-18 LAB — PHOSPHORUS: Phosphorus: 2.8 mg/dL (ref 2.5–4.6)

## 2019-09-18 LAB — MAGNESIUM: Magnesium: 1.9 mg/dL (ref 1.7–2.4)

## 2019-09-18 MED ORDER — HEPARIN (PORCINE) 25000 UT/250ML-% IV SOLN: 1200.0000 [IU]/h | INTRAVENOUS | Status: DC

## 2019-09-18 MED ORDER — HEPARIN (PORCINE) 25000 UT/250ML-% IV SOLN
1200.0000 [IU]/h | INTRAVENOUS | Status: DC
  Administered 2019-09-18: 1200 [IU]/h via INTRAVENOUS
  Filled 2019-09-18 (×2): qty 250

## 2019-09-18 MED ORDER — MAGNESIUM SULFATE 2 GM/50ML IV SOLN
2.0000 g | Freq: Once | INTRAVENOUS | Status: AC
  Administered 2019-09-18: 2 g via INTRAVENOUS
  Filled 2019-09-18: qty 50

## 2019-09-18 MED ORDER — POTASSIUM CHLORIDE 10 MEQ/100ML IV SOLN
10.0000 meq | INTRAVENOUS | Status: AC
  Administered 2019-09-18 (×3): 10 meq via INTRAVENOUS
  Filled 2019-09-18 (×3): qty 100

## 2019-09-18 NOTE — Progress Notes (Addendum)
[place] for heparin Indication: 5/20 Acute L subclavian DVT and hx mechanical mitral valve/Afib (PTA warfarin on hold)  No Known Allergies  Patient Measurements: Height: 5\' 8"  (172.7 cm) Weight: 52.2 kg (115 lb) IBW/kg (Calculated) : 68.4 Heparin Dosing Weight: 52.2 kg  Vital Signs: Temp: 98.1 F (36.7 C) (05/25 0813) Temp Source: Oral (05/25 0813) BP: 100/61 (05/25 0813) Pulse Rate: 78 (05/25 0813)  Labs: . Recent Labs    08/30/2019 1045 09/18/2019 1045 09/08/2019 1116 09/15/19 0229 09/15/19 0300 09/15/19 1349  HGB 9.9*   < > 10.2*  --  8.7*  --   HCT 29.0*  --  33.3*  --  28.2*  --   PLT  --   --  233  --  193  --   APTT  --   --  35  --   --   --   LABPROT  --   --  20.3* 20.6*  --   --   INR  --   --  1.8* 1.8*  --   --   HEPARINUNFRC  --   --   --  0.18*  --  0.24*  CREATININE 0.40*  --  0.48* 0.49*  --   --    < > = values in this interval not displayed.     Estimated Creatinine Clearance: 63.4 mL/min (A) (by C-G formula based on SCr of 0.4 mg/dL (L)).   Medical History: Past Medical History:  Diagnosis Date  . Atrial fibrillation with RVR ([place])   . DVT (deep venous thrombosis) ([place])   . H/O mitral valve replacement with mechanical valve   . Pacemaker 2001  . PE (pulmonary thromboembolism) [facility])     Assessment: 69 year old M on warfarin PTA for atrial fibrillation (CHADS2VASc = 3), mechanical mitral valve and L subclavian DVT seen on outside imaging April 2021 and is still present 09/13/19. Last dose of warfarin 09/13/19, admit INR 1.8, presumed goal is 2.5 -3.5 for mechanical MV. Of note, patient had severe RP bleed in Nov 2020 and has poor PO intake for several months with 35 lb weight loss. Pharmacy consulted to dose heparin while warfarin on hold for multiple procedures.  S/p biopsy 5/24 with plans for EGD 5/26  INR up to 2.6 today despite warfarin being on hold  Home warfarin regimen: 2.5mg  MWF, 5mg  TTSS (goal INR  should be 2.5 to 3.5)  Goal of Therapy:  Heparin level 0.3-0.7 units/ml   Monitor platelets by anticoagulation protocol: Yes  INR goal 2.5 to 3.5   Plan:  Continue heparin at 1200 units / hr (INR goal should be 2.5 to 3.5) Heparin to be held for EGD at 6:30 am Daily heparin level, CBC, INR  Thank you Anette Guarneri, PharmD   Please check AMION for all [place] phone numbers After 10:00 PM, call [place]

## 2019-09-18 NOTE — Progress Notes (Addendum)
Daily Rounding Note  09/18/2019, 8:16 AM  LOS: 4 days   SUBJECTIVE:   Chief complaint: dysphagia, wt loss.  Neck mass No complaints.  Wound care to sacral decubs in progress  OBJECTIVE:         Vital signs in last 24 hours:    Temp:  [98.1 F (36.7 C)-98.8 F (37.1 C)] 98.1 F (36.7 C) (05/25 0813) Pulse Rate:  [74-98] 78 (05/25 0813) Resp:  [18-25] 19 (05/25 0813) BP: (86-112)/(42-66) 100/61 (05/25 0813) SpO2:  [90 %-98 %] 91 % (05/25 0813) Last BM Date: 09/17/19 Filed Weights   09/12/2019 1118  Weight: 52.2 kg   General: cachectic, chronic/acutely ill looking   Heart: RRR Chest: no labored breathing.  BS diminished but clear Abdomen: soft, NT, BS hypoactive.  Supra-pubic cath present GU: mild penile and scrotal edema Derm: deep sacral decub w necrotic tissue, circuference of a small orange.    Extremities: muscle wasting in legs >> arms. Neuro/Psych:  Alert, follows commands.  Confused.    Intake/Output from previous day: 05/24 0701 - 05/25 0700 In: 1572.8 [I.V.:1272.8; IV Piggyback:300] Out: 500 [Urine:500]  Intake/Output this shift: No intake/output data recorded.  Lab Results: Recent Labs    09/16/19 0429 09/17/19 0458 09/18/19 0350  WBC 9.0 8.5 8.6  HGB 9.7* 9.3* 9.4*  HCT 31.8* 30.9* 31.2*  PLT 254 236 226   BMET Recent Labs    09/16/19 0429 09/17/19 0458 09/18/19 0350  NA 145 149* 148*  K 3.8 3.5 3.4*  CL 123* 123* 121*  CO2 19* 20* 17*  GLUCOSE 112* 100* 91  BUN 17 11 10   CREATININE 0.50* 0.51* 0.40*  CALCIUM 7.4* 7.6* 7.5*   LFT No results for input(s): PROT, ALBUMIN, AST, ALT, ALKPHOS, BILITOT, BILIDIR, IBILI in the last 72 hours. PT/INR Recent Labs    09/17/19 1426 09/18/19 0350  LABPROT 26.2* 27.3*  INR 2.5* 2.6*   Hepatitis Panel No results for input(s): HEPBSAG, HCVAB, HEPAIGM, HEPBIGM in the last 72 hours.  Studies/Results: DG Swallowing Func-Speech  Pathology  Result Date: 09/16/2019 Bedside Swallow Evaluation   CLINICAL IMPRESSIONS 09/16/2019 Clinical Impression Pt's swallow response was delayed to the level of the pyriforms.  Airway protection was adequate with nectar thick liquids via teaspoon and straw as well as purees.  Pt had trace aspiration of thin liquids via teaspoon due to decreased airway protection resulting from limited anterior laryngeal movement and he was unable to clear solids from his oral cavity due to dry mouth and weakened mastication.  Pt also had decreased clearance of boluses through the UES and his daughter relays a somewhat prolonged history of complaints consistent with esophageal dysphagia, including early satiety and belching throughout meals.  Esophageal sweep revealed stasis in the lower 1/3 of esophagus.  Pt would benefit from an assessment of esophageal function. Recommend that pt be advanced to nectar thick liquids via straw to facilitate improved hydration and energy conservation with continuation of pureed solids.  Pt may be a good candidate for the water protocol with teaspoons of thin liquids in between meals to continue working towards liquids progression.   SLP Visit Diagnosis Dysphagia, pharyngoesophageal phase (R13.14);Dysphagia, oropharyngeal phase (R13.12) Attention and concentration deficit following -- Frontal lobe and executive function deficit following -- Impact on safety and function Moderate aspiration risk   DIET RECOMMENDATION 09/16/2019 SLP Diet Recommendations Dysphagia 1 (Puree) solids;Nectar thick liquid Liquid Administration via Straw Medication Administration Crushed with puree Compensations Minimize  environmental distractions;Slow rate;Small sips/bites Postural Changes Remain semi-upright after after feeds/meals (Comment);Seated upright at 90 degrees   [place] CORE BIOPSY (LYMPH NODES)  Result Date: 09/17/2019 IMPRESSION: Status post ultrasound-guided biopsy of infiltrative mass of the left  supraclavicular region. Signed, Dulcy Fanny. Dellia Nims, RPVI [facility] Electronically Signed   By: Corrie Mckusick D.O.   On: 09/17/2019 12:02    ASSESMENT:   *   Dysphagia, weight loss.  Unsuccessful attempt to place G-tube at hospital in [place].  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  Suspicious for malignancy.  Nodal biopsy this morning, await pathology.  ? If this is cause for his swallowing issues?  *   Acute left subclavian DVT  *    Atrial fibrillation, mechanical MVR.  DVT, PE.  Chronic Coumadin, currently on hold with heparin drip in place.. INR 1.8 at arrival but 2.6 today despite no Warfarin since PTA  *   Protein calorie malnutrition.  *    Normocytic anemia.  *    TSH 8.3.  No free T4 assay.    *   Hypernatremia.  Hypokalemia.     PLAN   *   EGD postponed til 1230 PM tmrw.  resume d1, nectar diet for today.     *  Allow INR to drift.  Needs to be </= to 2 for EGD.    *   Resume heparin, stop again at 0630.    *  Await neck bx path.    *  Can resume feedings today, NPO after midnight.        Jorge Kennedy  09/18/2019, 8:16 AM Phone [phone]

## 2019-09-18 NOTE — Progress Notes (Signed)
SLP Cancellation Note  Patient Details Name: [name] MRN: [id number] DOB: 07-25-1950   Cancelled treatment:       Reason Eval/Treat Not Completed: Medical issues which prohibited therapy. Pt NPO today for EGD. Will f/u as able.     Osie Bond., M.A. [place] [facility] Pager [phone] Office [phone]  09/18/2019, 7:09 AM

## 2019-09-18 NOTE — Progress Notes (Signed)
HD#4 Subjective:  Overnight Events: no events overnight.   Jorge Kennedy was seen and evaluated at bedside. His son was also seen at bedside. He states that he is feeling slightly better than yesterday. We discussed his upcoming EGD to evaluated this stomach and throat. We discussed that his endoscopy was originally scheduled today, but due to an elevated INR will be scheduled for tomorrow. We discussed sedation techniques used for EGD.All questions and concerns were addressed.    Objective:  Vital signs in last 24 hours: Vitals:   09/17/19 1459 09/17/19 1930 09/17/19 2314 09/18/19 0308  BP: 94/62 (!) 107/59 101/66 105/66  Pulse: 98 85 88 81  Resp: 20 18 18 18   Temp: 98.7 F (37.1 C) 98.4 F (36.9 C) 98.8 F (37.1 C) 98.4 F (36.9 C)  TempSrc: Oral Oral Oral Oral  SpO2: 90% 90% 91% 92%  Weight:      Height:       Supplemental O2: Nasal Cannula SpO2: 92 % O2 Flow Rate (L/min): 2 L/min   Physical Exam:  Physical Exam  Constitutional: No distress.  HENT:  Head: Normocephalic and atraumatic.  Eyes: EOM are normal.  Cardiovascular: Normal rate and intact distal pulses.  Pulmonary/Chest: Effort normal and breath sounds normal. No respiratory distress.  Abdominal: Soft. He exhibits no distension. There is no abdominal tenderness.  Musculoskeletal:        General: Edema (LE edema) present.     Cervical back: Normal range of motion.  Neurological:  Patient appears sleepy and he is still disoriented.  He has generalized weakness.  Skin: Skin is warm and dry. He is not diaphoretic.    Filed Weights   09/15/2019 1118  Weight: 52.2 kg     Intake/Output Summary (Last 24 hours) at 09/18/2019 0618 Last data filed at 09/18/2019 0308 Gross per 24 hour  Intake 1572.79 ml  Output 500 ml  Net 1072.79 ml   Net IO Since Admission: 4,703.68 mL [09/18/19 0618]  Pertinent Labs: CBC Latest Ref Rng & Units 09/18/2019 09/17/2019 09/16/2019  WBC 4.0 - 10.5 K/uL 8.6 8.5 9.0  Hemoglobin  13.0 - 17.0 g/dL 9.4(L) 9.3(L) 9.7(L)  Hematocrit 39.0 - 52.0 % 31.2(L) 30.9(L) 31.8(L)  Platelets 150 - 400 K/uL 226 236 254    CMP Latest Ref Rng & Units 09/18/2019 09/17/2019 09/16/2019  Glucose 70 - 99 mg/dL 91 100(H) 112(H)  BUN 8 - 23 mg/dL 10 11 17   Creatinine 0.61 - 1.24 mg/dL 0.40(L) 0.51(L) 0.50(L)  Sodium 135 - 145 mmol/L 148(H) 149(H) 145  Potassium 3.5 - 5.1 mmol/L 3.4(L) 3.5 3.8  Chloride 98 - 111 mmol/L 121(H) 123(H) 123(H)  CO2 22 - 32 mmol/L 17(L) 20(L) 19(L)  Calcium 8.9 - 10.3 mg/dL 7.5(L) 7.6(L) 7.4(L)  Total Protein 6.5 - 8.1 g/dL - - -  Total Bilirubin 0.3 - 1.2 mg/dL - - -  Alkaline Phos 38 - 126 U/L - - -  AST 15 - 41 U/L - - -  ALT 0 - 44 U/L - - -    Pending Labs: 1.  Free light chains 2.  Serum protein electrophoresis 3.  Left supraclavicular lymph node pathology  Imaging: none  Assessment/Plan:   Principal Problem:   Protein calorie malnutrition (HCC) Active Problems:   Encephalopathy   CAD (coronary artery disease)   Myositis   DVT of axillary vein, chronic left (HCC)   History of pulmonary embolism   History of mitral valve replacement with mechanical valve   Aortic  atherosclerosis ([place])   Suprapubic catheter ([place])   Bilateral hydronephrosis   Metastatic malignant neoplasm ([place])   Neck mass   Pressure injury of skin   Patient Summary: Jorge Kennedy is a 69 y.o. who  has a past medical history of Atrial fibrillation with RVR ([place]), DVT (deep venous thrombosis) ([place]), H/O mitral valve replacement with mechanical valve, Pacemaker (2001), and PE (pulmonary thromboembolism) ([place])., presented with subacute alter mental status and admit for rapid decline in functional and cognitive status on hospital day 4  Subacute Encephalopathy, unknown etiology  Declining functional status -Stroke and infectious etiologies are increasingly less likely, although patient has not had a lumbar puncture in the setting of anticoagulation and MRI of the brain due  to pacemaker. - Concern for occult malignancy as the etiology for his subacute encephalopathy based on clinical picture including rapid decline in weight/functional status and CT scan showing lytic and sclerotic lesions throughout the lumbar and thoracic spine and the presence of a Virchow's. - We will continue work-up for malignancy as outline below.   Occult Malignancy Subclavian VTE -Had a 40 pound weight loss over the last 6 months with concern for occult malignancy.  Currently most likely primary consist of GI (esophageal versus gastric) or prostate.  Patient's prostate was significantly enlarged on CT scan but PSA was within normal limits. -CT scan shows sclerotic and lytic lesions throughout the lumbar and thoracic spine and Virchow's node present on the left supraclavicular space. -IR performed a needle biopsy of his left supraclavicular node yesterday. -GI consulted yesterday and plan to perform upper endoscopy and NG tube placement.  - Pending work up for multiple myeloma, although I have low suspicions.  - I appreciate GI and IR help comanaging  this complex patient.   SevereProtein caloriemalnutrition: -Patient continues to have pretty significant protein caloric malnutrition in the setting of decreased p.o. intake.  He was initiated on dysphagia 1 diet after speech evaluation during this hospitalization.  He continues to have poor p.o. intake.  I consulted GI for upper endoscopy yesterday and they agreed to place an NG tube during this procedure. -I appreciate GIs assistance comanaging this patient.  CAD s/p CABGand mechanical mitral valveon chronic warfarin Sick sinus syndromes/p pacemaker  HLD -Continue statin medication for secondary prevention -Switched from warfarin to heparin  Klebsiella Urinary tract infection: -Is on day 5/7 of ertapenem in the setting of previously diagnosed urinary tract infection due to Klebsiella. -Patient was started on ertapenem during his  previous hospitalization in [place] and received only 3 days during that hospitalization.  Atrial fibrillation: -Continue amiodarone 200 mg twice daily and metoprolol 25 mg twice daily -Continue heparin for anticoagulation  Sacral decubitus ulcer: -Appreciate wound care's assistance  Diet: Heart Healthy IVF: None,None VTE: Heparin Code: DNR PT/OT recs: None TOC recs: None   Dispo: Anticipated discharge pending further evaluation and management.    [name], D.O. [facility], PGY-1 Date 09/18/2019 Time 6:18 AM Pager: [phone]

## 2019-09-18 NOTE — Plan of Care (Signed)
  Problem: Pain Managment: Goal: General experience of comfort will improve Outcome: Progressing   Problem: Safety: Goal: Ability to remain free from injury will improve Outcome: Progressing   

## 2019-09-18 NOTE — Progress Notes (Signed)
Initial Nutrition Assessment  DOCUMENTATION CODES:   Severe malnutrition in context of chronic illness  INTERVENTION:   -Vital Cusine shakes po TID, each supplement provides 500 kcals and 22 grams of protein   -If NG tube is placed and nutrition support is to be initiated, consider:  Osmolite 1.5 cal @ 25 ml/hr, increase 15ml/hr Q4H until goal rate of 15ml/hr (1343ml) is reached  54ml Pro-stat daily  At goal, recommended tube feeding regimen will provide 2080 kcals, 97 grams protein, 1073ml free water.  NUTRITION DIAGNOSIS:   Severe Malnutrition related to chronic illness(RP hemorrhage and subsequent development of myositis ossificans; occult malignancy) as evidenced by percent weight loss, energy intake < or equal to 75% for > or equal to 1 month, severe muscle depletion, severe fat depletion.    GOAL:   Patient will meet greater than or equal to 90% of their needs    MONITOR:   Supplement acceptance, PO intake, Labs, Weight trends, I & O's, Skin  REASON FOR ASSESSMENT:   Low Braden    ASSESSMENT:   Pt presented with AMS and was admitted for rapid decline in functional and cognitive status. Previous admission in [facility] for severe dehydration involved failed feeding tube placement and failed swallow evaluation. PMH includes Afib with RVR, DVT, h/o mitral valve replacement, pacemaker, PE, and large RP hemorrhage and subsequent development of myositis ossificans.  Per MD, there is concern for occult malignancy as the etiology for the subacute encephalopathy. CT revealed sclerotic and lytic lesions throughout the lumbar and thoracic spine and Virchow's node present on the L supraclavicular node.   5/24 - nodal biopsy, awaiting pathology.   Pt awaiting potential upper endoscopy and potential placement of NG tube.   Pt unable to provide answers to RD questions today. No family at bedside.  Per H&P, pt has had a rapid decline in his functional status, weight, and appetite  since November. Pt's family states pt went from his UBW of 150 lbs to 115 lbs in the span of 6 months. This is a 23% wt loss x6 months, which is significant for time frame. Family states that pt has been eating very little. Per pt's family, this decline occurred after pt sustained a fall with large RP hemorrhage and subsequent development of myositis ossificans.   PO Intake: 35% x 1 recorded meal  Labs: Na 148 (H), K+ 3.4 (L) Medications reviewed.  NUTRITION - FOCUSED PHYSICAL EXAM:    Most Recent Value  Orbital Region  Severe depletion  Upper Arm Region  Severe depletion  Thoracic and Lumbar Region  Severe depletion  Buccal Region  Severe depletion  Temple Region  Severe depletion  Clavicle Bone Region  Severe depletion  Clavicle and Acromion Bone Region  Severe depletion  Scapular Bone Region  Severe depletion  Dorsal Hand  Severe depletion  Patellar Region  Severe depletion  Anterior Thigh Region  Severe depletion  Posterior Calf Region  Severe depletion  Edema (RD Assessment)  Moderate  Hair  Reviewed  Eyes  Reviewed  Mouth  Reviewed  Skin  Reviewed  Nails  Reviewed       Diet Order:   Diet Order            Diet NPO time specified  Diet effective midnight        DIET - DYS 1 Room service appropriate? No; Fluid consistency: Nectar Thick  Diet effective now              EDUCATION NEEDS:  Not appropriate for education at this time  Skin:  Skin Assessment: Skin Integrity Issues: Skin Integrity Issues:: Stage III, Incisions Stage III: sacrum Incisions: neck  Last BM:  5/24  Height:   Ht Readings from Last 1 Encounters:  09/07/2019 5\' 8"  (1.727 m)    Weight:   Wt Readings from Last 1 Encounters:  09/13/2019 52.2 kg   BMI:  Body mass index is 17.49 kg/m.  Estimated Nutritional Needs:   Kcal:  1900-2100  Protein:  95-110 grams  Fluid:  >/= 1.9L/d    Larkin Ina, MS, RD, LDN RD pager number and weekend/on-call pager number located in  [place].

## 2019-09-18 NOTE — Progress Notes (Signed)
Physical Therapy Wound Treatment Patient Details  Name: Jorge Kennedy MRN: [id number] Date of Birth: 12-Apr-1951  Today's Date: 09/18/2019 Time: 0920-1013 Time Calculation (min): 53 min  Subjective  Subjective: Pt son in room on entry. Pt GI procedure cancelled due to low Hgb.  Patient and Family Stated Goals: improved wound healing and increased protein intake Prior Treatments: Santyl and dressing changes  Pain Score: Pt premedicated prior to therapy however continues to exhibit, 8/10 pain on facial pain scale with pulse lavage.   Wound Assessment  Pressure Injury 09/15/19 Sacrum Posterior Stage 3 -  Full thickness tissue loss. Subcutaneous fat may be visible but bone, tendon or muscle are NOT exposed. sarum pressure ulcer from facility (Active)  Wound Image   09/18/19 1100  Dressing Type ABD;Barrier Film (skin prep);Gauze (Comment);Moist to moist;Other (Comment) 09/18/19 1100  Dressing Changed;Clean;Dry;Intact 09/18/19 1100  Dressing Change Frequency Daily 09/18/19 1100  State of Healing Eschar 09/18/19 1100  Site / Wound Assessment Brown;Black;Yellow;Red;Pink;Painful;Granulation tissue 09/18/19 1100  % Wound base Red or Granulating 15% 09/18/19 1100  % Wound base Yellow/Fibrinous Exudate 45% 09/18/19 1100  % Wound base Black/Eschar 40% 09/18/19 1100  Peri-wound Assessment Erythema (blanchable);Edema;Pink 09/18/19 1100  Wound Length (cm) 10.6 cm 09/17/19 1215  Wound Width (cm) 8.2 cm 09/17/19 1215  Wound Depth (cm) 1.5 cm 09/17/19 1215  Wound Surface Area (cm^2) 86.92 cm^2 09/17/19 1215  Wound Volume (cm^3) 130.38 cm^3 09/17/19 1215  Undermining (cm) 1 cm 11 o'clock to 4 o'clock 09/17/19 1215  Margins Unattached edges (unapproximated) 09/18/19 1100  Drainage Amount Moderate 09/18/19 1100  Drainage Description Serosanguineous 09/18/19 1100  Treatment Debridement (Selective);Hydrotherapy (Pulse lavage);Packing (Saline gauze) 09/18/19 1100   Santyl applied to necrotic tissue    Hydrotherapy Pulsed lavage therapy - wound location: sacrum Pulsed Lavage with Suction (psi): 12 psi Pulsed Lavage with Suction - Normal Saline Used: 1000 mL Pulsed Lavage Tip: Tip with splash shield Selective Debridement Selective Debridement - Location: sacrum Selective Debridement - Tools Used: Forceps;Scalpel Selective Debridement - Tissue Removed: softened black eschar and yellow stringy slough   Wound Assessment and Plan  Wound Therapy - Assess/Plan/Recommendations Wound Therapy - Clinical Statement: Pt agreeable to therapy this morning. Black eschar is softening and turning brown, able to remove an appreciable amount today to decrease bioburden and improve healing.  Wound Therapy - Functional Problem List: decreased mobility  Factors Delaying/Impairing Wound Healing: Multiple medical problems;Immobility;Other (comment)(protein malnutrition ) Hydrotherapy Plan: Debridement;Pulsatile lavage with suction;Patient/family education;Dressing change Wound Therapy - Frequency: 6X / week Wound Therapy - Follow Up Recommendations: Skilled nursing facility Wound Plan: see above  Wound Therapy Goals- Improve the function of patient's integumentary system by progressing the wound(s) through the phases of wound healing (inflammation - proliferation - remodeling) by: Decrease Necrotic Tissue to: 50 Decrease Necrotic Tissue - Progress: Progressing toward goal Increase Granulation Tissue to: 50 Increase Granulation Tissue - Progress: Progressing toward goal Goals/treatment plan/discharge plan were made with and agreed upon by patient/family: Yes Time For Goal Achievement: 7 days Wound Therapy - Potential for Goals: Fair  Goals will be updated until maximal potential achieved or discharge criteria met.  Discharge criteria: when goals achieved, discharge from hospital, MD decision/surgical intervention, no progress towards goals, refusal/missing three consecutive treatments without notification  or medical reason.  GP    Dani Gobble. Migdalia Dk PT, DPT Acute Rehabilitation Services Pager [phone] Office [phone]  [place] 09/18/2019, 11:59 AM

## 2019-09-19 ENCOUNTER — Inpatient Hospital Stay (HOSPITAL_COMMUNITY): Payer: Medicare HMO

## 2019-09-19 DIAGNOSIS — R1310 Dysphagia, unspecified: Secondary | ICD-10-CM

## 2019-09-19 LAB — BASIC METABOLIC PANEL
Anion gap: 8 (ref 5–15)
BUN: 9 mg/dL (ref 8–23)
CO2: 19 mmol/L — ABNORMAL LOW (ref 22–32)
Calcium: 7.4 mg/dL — ABNORMAL LOW (ref 8.9–10.3)
Chloride: 121 mmol/L — ABNORMAL HIGH (ref 98–111)
Creatinine, Ser: 0.44 mg/dL — ABNORMAL LOW (ref 0.61–1.24)
GFR calc Af Amer: >60 mL/min (ref >60)
GFR calc non Af Amer: >60 mL/min (ref >60)
Glucose, Bld: 105 mg/dL — ABNORMAL HIGH (ref 70–99)
Potassium: 3.3 mmol/L — ABNORMAL LOW (ref 3.5–5.1)
Sodium: 148 mmol/L — ABNORMAL HIGH (ref 135–145)

## 2019-09-19 LAB — PROTEIN ELECTROPHORESIS, SERUM
A/G Ratio: 0.9 (ref 0.7–1.7)
Albumin ELP: 1.7 g/dL — ABNORMAL LOW (ref 2.9–4.4)
Alpha-1-Globulin: 0.2 g/dL (ref 0.0–0.4)
Alpha-2-Globulin: 0.7 g/dL (ref 0.4–1.0)
Beta Globulin: 0.6 g/dL — ABNORMAL LOW (ref 0.7–1.3)
Gamma Globulin: 0.5 g/dL (ref 0.4–1.8)
Globulin, Total: 1.9 g/dL — ABNORMAL LOW (ref 2.2–3.9)
M-Spike, %: 0.1 g/dL — ABNORMAL HIGH
Total Protein ELP: 3.6 g/dL — ABNORMAL LOW (ref 6.0–8.5)

## 2019-09-19 LAB — CBC
HCT: 31.8 % — ABNORMAL LOW (ref 39.0–52.0)
Hemoglobin: 9.6 g/dL — ABNORMAL LOW (ref 13.0–17.0)
MCH: 27.7 pg (ref 26.0–34.0)
MCHC: 30.2 g/dL (ref 30.0–36.0)
MCV: 91.9 fL (ref 80.0–100.0)
Platelets: 251 10*3/uL (ref 150–400)
RBC: 3.46 MIL/uL — ABNORMAL LOW (ref 4.22–5.81)
RDW: 23.3 % — ABNORMAL HIGH (ref 11.5–15.5)
WBC: 10.3 10*3/uL (ref 4.0–10.5)
nRBC: 0 % (ref 0.0–0.2)

## 2019-09-19 LAB — MAGNESIUM: Magnesium: 1.9 mg/dL (ref 1.7–2.4)

## 2019-09-19 LAB — KAPPA/LAMBDA LIGHT CHAINS
Kappa free light chain: 19.2 mg/L (ref 3.3–19.4)
Kappa, lambda light chain ratio: 0.87 (ref 0.26–1.65)
Lambda free light chains: 22 mg/L (ref 5.7–26.3)

## 2019-09-19 LAB — PROTIME-INR
INR: 2.5 — ABNORMAL HIGH (ref 0.8–1.2)
INR: 2.4 — ABNORMAL HIGH (ref 0.8–1.2)
Prothrombin Time: 26.4 seconds — ABNORMAL HIGH (ref 11.4–15.2)
Prothrombin Time: 25.1 seconds — ABNORMAL HIGH (ref 11.4–15.2)

## 2019-09-19 LAB — GLUCOSE, CAPILLARY: Glucose-Capillary: 103 mg/dL — ABNORMAL HIGH (ref 70–99)

## 2019-09-19 MED ORDER — MELATONIN 5 MG PO TABS
10.0000 mg | ORAL_TABLET | Freq: Every day | ORAL | Status: DC
  Administered 2019-09-20: 10 mg via ORAL
  Filled 2019-09-19: qty 2

## 2019-09-19 MED ORDER — DEXTROSE 5 % IV SOLN: INTRAVENOUS | Status: DC

## 2019-09-19 MED ORDER — HEPARIN (PORCINE) 25000 UT/250ML-% IV SOLN
1250.0000 [IU]/h | INTRAVENOUS | Status: AC
  Administered 2019-09-19: 1250 [IU]/h via INTRAVENOUS
  Filled 2019-09-19: qty 250

## 2019-09-19 MED ORDER — POTASSIUM CHLORIDE 10 MEQ/100ML IV SOLN
10.0000 meq | INTRAVENOUS | Status: AC
  Administered 2019-09-19 (×2): 10 meq via INTRAVENOUS
  Filled 2019-09-19: qty 100

## 2019-09-19 NOTE — Progress Notes (Signed)
INR is 2.5.  Needs to be </= to 2  For EGD.  So EGD postponed to tmrw. In meantiime ordering Ba esophagram (not SLP swallow eval).  Hopefully this can get done today and provide more insight into pt's swallowing challenges. Waiting on neck bx path report.    Azucena Freed PA-C

## 2019-09-19 NOTE — Progress Notes (Addendum)
Physical Therapy Wound Treatment Patient Details  Name: Jorge Kennedy MRN: [id number] Date of Birth: 04/22/1951  Today's Date: 09/19/2019 Time: 1025-1103 Time Calculation (min): 38 min  Subjective  Subjective: Pt son and daughter in room requesting minimal pain medication so pt can participate in barium swallow later today. Agree to pulse lavage, minimal debridement and dressing change Patient and Family Stated Goals: improved wound healing and increased protein intake Prior Treatments: Santyl and dressing changes  Pain Score:  Pt with 8/10 facial pain score with turning for treatment and during hydrotherapy.  Wound Assessment  Pressure Injury 09/15/19 Sacrum Posterior Stage 3 -  Full thickness tissue loss. Subcutaneous fat may be visible but bone, tendon or muscle are NOT exposed. sarum pressure ulcer from facility (Active)  Wound Image   09/18/19 1100  Dressing Type ABD;Barrier Film (skin prep);Gauze (Comment);Moist to moist;Other (Comment) 09/19/19 1600  Dressing Changed;Clean;Dry;Intact 09/19/19 1600  Dressing Change Frequency Daily 09/19/19 1600  State of Healing Eschar 09/19/19 1600  Site / Wound Assessment Brown;Black;Yellow;Red;Pink;Painful;Granulation tissue 09/19/19 1600  % Wound base Red or Granulating 15% 09/19/19 1600  % Wound base Yellow/Fibrinous Exudate 45% 09/19/19 1600  % Wound base Black/Eschar 40% 09/19/19 1600  Peri-wound Assessment Erythema (blanchable);Edema;Pink 09/19/19 1600  Wound Length (cm) 10.6 cm 09/19/19 1000  Wound Width (cm) 8.2 cm 09/19/19 1000  Wound Depth (cm) 1.5 cm 09/19/19 1000  Wound Surface Area (cm^2) 86.92 cm^2 09/19/19 1000  Wound Volume (cm^3) 130.38 cm^3 09/19/19 1000  Undermining (cm) 1 cm 11 o'clock to 4 o'clock 09/17/19 1215  Margins Unattached edges (unapproximated) 09/19/19 1600  Drainage Amount Moderate 09/19/19 1600  Drainage Description Serosanguineous 09/19/19 1600  Treatment Debridement (Selective);Hydrotherapy (Pulse  lavage);Packing (Saline gauze) 09/19/19 1600   Santyl applied to necrotic tissue    Hydrotherapy Pulsed lavage therapy - wound location: sacrum Pulsed Lavage with Suction (psi): 12 psi Pulsed Lavage with Suction - Normal Saline Used: 1000 mL Pulsed Lavage Tip: Tip with splash shield Selective Debridement Selective Debridement - Location: sacrum Selective Debridement - Tools Used: Forceps;Scalpel Selective Debridement - Tissue Removed: softened black eschar and yellow stringy slough   Wound Assessment and Plan  Wound Therapy - Assess/Plan/Recommendations Wound Therapy - Clinical Statement: Pt with less awareness of dressing change today despite maximal explanation. Pt tolerated hydrotherapy well, more painful from turning on his side. Pt will continue to benefit from hydrotherapy for decrease bioburden to promote healing.  Wound Therapy - Functional Problem List: decreased mobility  Factors Delaying/Impairing Wound Healing: Multiple medical problems;Immobility;Other (comment)(protein malnutrition ) Hydrotherapy Plan: Debridement;Pulsatile lavage with suction;Patient/family education;Dressing change Wound Therapy - Frequency: 6X / week Wound Therapy - Follow Up Recommendations: Skilled nursing facility Wound Plan: see above  Wound Therapy Goals- Improve the function of patient's integumentary system by progressing the wound(s) through the phases of wound healing (inflammation - proliferation - remodeling) by: Decrease Necrotic Tissue to: 50 Decrease Necrotic Tissue - Progress: Progressing toward goal Increase Granulation Tissue to: 50 Increase Granulation Tissue - Progress: Progressing toward goal Goals/treatment plan/discharge plan were made with and agreed upon by patient/family: Yes Time For Goal Achievement: 7 days Wound Therapy - Potential for Goals: Fair  Goals will be updated until maximal potential achieved or discharge criteria met.  Discharge criteria: when goals achieved,  discharge from hospital, MD decision/surgical intervention, no progress towards goals, refusal/missing three consecutive treatments without notification or medical reason.  GP    Dani Gobble. Migdalia Dk PT, DPT Acute Rehabilitation Services Pager [phone] Office [phone]  Benjamine Mola  Gust [place] 09/19/2019, 5:30 PM

## 2019-09-19 NOTE — Progress Notes (Signed)
HD#5 Subjective:  Overnight Events: no overnight events.  Jorge Kennedy patient is resting comfortably in bed.  He has fluctuating orientation to family and himself.  Otherwise he denies any concerns at this time.  Objective:  Vital signs in last 24 hours: Vitals:   09/18/19 2034 09/18/19 2037 09/19/19 0008 09/19/19 0403  BP: (!) 108/23 (!) 95/59 (!) 99/57 119/67  Pulse: 81 92 90 86  Resp: 19 18 18 19   Temp: 98.9 F (37.2 C) 97.6 F (36.4 C) 98 F (36.7 C) 98.7 F (37.1 C)  TempSrc: Oral Oral Axillary Oral  SpO2: 91% 91% 90% 92%  Weight:      Height:       Supplemental O2: RA and intermittent [place] SpO2: 92 % O2 Flow Rate (L/min): 3 L/min   Physical Exam:  Physical Exam  Constitutional: He appears malnourished. He appears cachectic. No distress.  Patient sobered and has fluctuating orientation.  HENT:  Head: Normocephalic and atraumatic.  Eyes: EOM are normal.  Cardiovascular: Normal rate and intact distal pulses.  Pulmonary/Chest: Effort normal. No respiratory distress. He exhibits tenderness.  Abdominal: Soft. He exhibits no distension. There is no abdominal tenderness.  Musculoskeletal:        General: Edema (Left upper extremity and bilateral lower extremities.) present. Normal range of motion.     Cervical back: Normal range of motion.  Neurological: He is alert.  Diffusely weak  Skin: Skin is warm and dry. He is not diaphoretic.    Filed Weights   09/13/2019 1118  Weight: 52.2 kg     Intake/Output Summary (Last 24 hours) at 09/19/2019 0557 Last data filed at 09/19/2019 0011 Gross per 24 hour  Intake 459.31 ml  Output 250 ml  Net 209.31 ml   Net IO Since Admission: 4,912.99 mL [09/19/19 0557]  Pertinent Labs: CBC Latest Ref Rng & Units 09/19/2019 09/18/2019 09/17/2019  WBC 4.0 - 10.5 K/uL 10.3 8.6 8.5  Hemoglobin 13.0 - 17.0 g/dL 9.6(L) 9.4(L) 9.3(L)  Hematocrit 39.0 - 52.0 % 31.8(L) 31.2(L) 30.9(L)  Platelets 150 - 400 K/uL 251 226 236    CMP  Latest Ref Rng & Units 09/18/2019 09/17/2019 09/16/2019  Glucose 70 - 99 mg/dL 91 100(H) 112(H)  BUN 8 - 23 mg/dL 10 11 17   Creatinine 0.61 - 1.24 mg/dL 0.40(L) 0.51(L) 0.50(L)  Sodium 135 - 145 mmol/L 148(H) 149(H) 145  Potassium 3.5 - 5.1 mmol/L 3.4(L) 3.5 3.8  Chloride 98 - 111 mmol/L 121(H) 123(H) 123(H)  CO2 22 - 32 mmol/L 17(L) 20(L) 19(L)  Calcium 8.9 - 10.3 mg/dL 7.5(L) 7.6(L) 7.4(L)  Total Protein 6.5 - 8.1 g/dL - - -  Total Bilirubin 0.3 - 1.2 mg/dL - - -  Alkaline Phos 38 - 126 U/L - - -  AST 15 - 41 U/L - - -  ALT 0 - 44 U/L - - -    Pending Labs: 1.  Free light chains 2.  Serum protein electrophoresis 3.  Left supraclavicular lymph node pathology  Imaging: No results found.   Assessment/Plan:   Principal Problem:   Protein calorie malnutrition (HCC) Active Problems:   Encephalopathy   CAD (coronary artery disease)   Myositis   DVT of axillary vein, chronic left (HCC)   History of pulmonary embolism   History of mitral valve replacement with mechanical valve   Aortic atherosclerosis (HCC)   Suprapubic catheter (HCC)   Bilateral hydronephrosis   Metastatic malignant neoplasm (HCC)   Neck mass   Pressure injury  of skin   Protein-calorie malnutrition, severe    Patient Summary: Jorge Kennedy is a 69 y.o. who  has a past medical history of Atrial fibrillation with RVR ([place]), DVT (deep venous thrombosis) ([place]), H/O mitral valve replacement with mechanical valve, Pacemaker (2001), and PE (pulmonary thromboembolism) ([place])., presented with subacute altered mental status  And cognitive/functional decline and admit for subacute encephalopathy and failure to thrive on hospital day 5  Subacute Encephalopathy, unknown etiology:  Declining functional status, Failure to thrive: Occult Malignancy: Subclavian VTE: -Biopsy of left supraclavicular node pending results -GI intend to perform an upper endoscopy today. -Appreciate GI and hours help comanaging this complex  patient.  SevereProtein caloriemalnutrition: -GI intend to perform barium swallow at the patient's INR still greater than 2.  -Plan for NG tube placement and upper endoscopy tomorrow.   CAD s/p CABGand mechanical mitral valveon chronic warfarin Sick sinus syndromes/p pacemaker  HLD -Continue statin for secondary prevention -On heparin for anticoagulation will stop this morning prior to upper endoscopy and restart shortly after.  Klebsiella Urinary tract infection: -Finished 10-day course of ertapenem  Atrial fibrillation: -Continue amiodarone 200 mg twice daily and metoprolol 25 mg twice daily -Continue heparin for anticoagulation will stop today for upper endoscopy and begin shortly after that  Sacral decubitus ulcer: -Appreciate wound care's assistance  Diet: NPO IVF: None,None VTE: Heparin Code: Full PT/OT recs: Pending TOC recs: Pending   Dispo: Anticipated discharge pending further evaluation and management.Marland Kitchen    Jorge Kennedy, D.O. MCIMTP, PGY-1 Date 09/19/2019 Time 5:57 AM Pager: [phone]

## 2019-09-19 NOTE — Progress Notes (Addendum)
[place] for heparin Indication: 5/20 Acute L subclavian DVT and hx mechanical mitral valve/Afib (PTA warfarin on hold)  No Known Allergies  Patient Measurements: Height: 5\' 8"  (172.7 cm) Weight: 52.2 kg (115 lb) IBW/kg (Calculated) : 68.4 Heparin Dosing Weight: 52.2 kg  Vital Signs: Temp: 98.9 F (37.2 C) (05/26 0726) Temp Source: Axillary (05/26 0726) BP: 112/56 (05/26 0726) Pulse Rate: 81 (05/26 0726)  Labs: . Recent Labs    09/22/2019 1045 09/22/2019 1045 08/28/2019 1116 09/15/19 0229 09/15/19 0300 09/15/19 1349  HGB 9.9*   < > 10.2*  --  8.7*  --   HCT 29.0*  --  33.3*  --  28.2*  --   PLT  --   --  233  --  193  --   APTT  --   --  35  --   --   --   LABPROT  --   --  20.3* 20.6*  --   --   INR  --   --  1.8* 1.8*  --   --   HEPARINUNFRC  --   --   --  0.18*  --  0.24*  CREATININE 0.40*  --  0.48* 0.49*  --   --    < > = values in this interval not displayed.     Estimated Creatinine Clearance: 63.4 mL/min (A) (by C-G formula based on SCr of 0.44 mg/dL (L)).   Medical History: Past Medical History:  Diagnosis Date  . Atrial fibrillation with RVR ([place])   . DVT (deep venous thrombosis) ([place])   . H/O mitral valve replacement with mechanical valve   . Pacemaker 2001  . PE (pulmonary thromboembolism) [facility])     Assessment: 69 year old M on warfarin PTA for atrial fibrillation (CHADS2VASc = 3), mechanical mitral valve and L subclavian DVT seen on outside imaging April 2021 and is still present 09/13/19. Last dose of warfarin 09/13/19, admit INR 1.8, presumed goal is 2.5 -3.5 for mechanical MV. Of note, patient had severe RP bleed in Nov 2020 and has poor PO intake for several months with 35 lb weight loss. Pharmacy consulted to dose heparin while warfarin on hold for multiple procedures.  S/p biopsy 5/24 with plans for EGD 5/27 when INR trends down to < 2  INR up to 2.5 today despite warfarin being on hold  Home warfarin  regimen: 2.5mg  MWF, 5mg  TTSS (goal INR should be 2.5 to 3.5)  Goal of Therapy:  Heparin level 0.3-0.7 units/ml   Monitor platelets by anticoagulation protocol: Yes  INR goal 2.5 to 3.5   Plan:  Recheck INR at noon today and restart heparin if trending down to < 2.5 Daily heparin level, CBC, INR  Thank you Anette Guarneri, PharmD   1400 pm: INR 2.4, heparin at 1250 units / hr, awaiting INR < 2 before EGD, stop order placed for heparin again at 6:30 am  Thank you Anette Guarneri, PharmD

## 2019-09-19 NOTE — Progress Notes (Signed)
  Speech Language Pathology Treatment: Dysphagia  Patient Details Name: Jorge Kennedy MRN: [id number] DOB: 1951/02/10 Today's Date: 09/19/2019 Time: 0901-0920 SLP Time Calculation (min) (ACUTE ONLY): 19 min  Assessment / Plan / Recommendation Clinical Impression  Jorge Kennedy demonstrates an ongoing dysphagia with noted decreased labial seal to take boluses from spoon (having to scrape bolus off spoon on his teeth). He had slow, but effective manipulation of boluses. He was seen during med administration with RN and daughter present. Daughter reports he is eating more now that he is on purees than he was prior to admission. He reported dislike of applesauce and was noted with strong gag reflex with meds in applesauce. Given change to meds in magic cup, pt had no gag or s/s aspiration. He continued with slow oral manipulation. Nectar via straw was provided as liquid wash to fully clear oral cavity. He had no s/s aspiration with this. Pt is pending further esophageal testing. He was scheduled for EGD, but INR remains too high for procedure. Barium swallow ordered to be completed this date. ST service to follow for further education and therapeutic management of his oropharyngeal (suspected esophageal) dysphagia. Daughter present and verbalized understanding and agreement. She was very thankful for SLP involvement.      HPI HPI: Saqib Kennedy is a 69 year old male with a pertinent past medical history of A. fib with RVR, CABG status post pacemaker, mechanical mitral valve disease warfarin), myositis ossificans, CAD, who presented to [facility] with AMS.  [place] off a ladder back in November, had significant internal bleeding at that time, has not been the same since. Has not been eating for quite some time. Tried to have feeding tube placed on Sunday secondary to poor PO intake; however, daughter reported that the surgeon was unable to place a G-tube secondary to stomach inflammation and poor expansion. Per daughter  report, pt underwent a FEES yesterday at [facility] and they reported that he is at risk for aspiration with all consistencies and they recommended 1/2 tsp of puree and tsp of nectar-thick liquid with alternative means of nutrition. Daughter mentions that he has had no appetite.      SLP Plan  Continue with current plan of care       Recommendations  Diet recommendations: Dysphagia 1 (puree);Nectar-thick liquid Liquids provided via: Straw Medication Administration: Crushed with puree Supervision: Staff to assist with self feeding Compensations: Minimize environmental distractions;Slow rate;Small sips/bites Postural Changes and/or Swallow Maneuvers: Seated upright 90 degrees;Upright 30-60 min after meal                Oral Care Recommendations: Staff/trained caregiver to provide oral care;Oral care BID Follow up Recommendations: 24 hour supervision/assistance;Skilled Nursing facility SLP Visit Diagnosis: Dysphagia, oropharyngeal phase (R13.12) Plan: Continue with current plan of care                      Jorge Kennedy P. Jorge Kennedy, M.S., CCC-SLP Speech-Language Pathologist Acute Rehabilitation Services Pager: [place] 09/19/2019, 9:23 AM

## 2019-09-20 ENCOUNTER — Inpatient Hospital Stay (HOSPITAL_COMMUNITY): Payer: Medicare HMO

## 2019-09-20 DIAGNOSIS — R933 Abnormal findings on diagnostic imaging of other parts of digestive tract: Secondary | ICD-10-CM

## 2019-09-20 LAB — CBC
HCT: 30.3 % — ABNORMAL LOW (ref 39.0–52.0)
Hemoglobin: 9.2 g/dL — ABNORMAL LOW (ref 13.0–17.0)
MCH: 27.7 pg (ref 26.0–34.0)
MCHC: 30.4 g/dL (ref 30.0–36.0)
MCV: 91.3 fL (ref 80.0–100.0)
Platelets: 217 10*3/uL (ref 150–400)
RBC: 3.32 MIL/uL — ABNORMAL LOW (ref 4.22–5.81)
RDW: 23.1 % — ABNORMAL HIGH (ref 11.5–15.5)
WBC: 9.4 10*3/uL (ref 4.0–10.5)
nRBC: 0 % (ref 0.0–0.2)

## 2019-09-20 LAB — PROTIME-INR
INR: 2.5 — ABNORMAL HIGH (ref 0.8–1.2)
INR: 2.4 — ABNORMAL HIGH (ref 0.8–1.2)
Prothrombin Time: 26.5 seconds — ABNORMAL HIGH (ref 11.4–15.2)
Prothrombin Time: 25.5 seconds — ABNORMAL HIGH (ref 11.4–15.2)

## 2019-09-20 LAB — COMPREHENSIVE METABOLIC PANEL
ALT: 12 U/L (ref 0–44)
AST: 15 U/L (ref 15–41)
Albumin: 1.2 g/dL — ABNORMAL LOW (ref 3.5–5.0)
Alkaline Phosphatase: 150 U/L — ABNORMAL HIGH (ref 38–126)
Anion gap: 6 (ref 5–15)
BUN: 10 mg/dL (ref 8–23)
CO2: 20 mmol/L — ABNORMAL LOW (ref 22–32)
Calcium: 7.2 mg/dL — ABNORMAL LOW (ref 8.9–10.3)
Chloride: 120 mmol/L — ABNORMAL HIGH (ref 98–111)
Creatinine, Ser: 0.43 mg/dL — ABNORMAL LOW (ref 0.61–1.24)
GFR calc Af Amer: >60 mL/min (ref >60)
GFR calc non Af Amer: >60 mL/min (ref >60)
Glucose, Bld: 153 mg/dL — ABNORMAL HIGH (ref 70–99)
Potassium: 3.3 mmol/L — ABNORMAL LOW (ref 3.5–5.1)
Sodium: 146 mmol/L — ABNORMAL HIGH (ref 135–145)
Total Bilirubin: 0.8 mg/dL (ref 0.3–1.2)
Total Protein: 3.5 g/dL — ABNORMAL LOW (ref 6.5–8.1)

## 2019-09-20 LAB — MAGNESIUM: Magnesium: 1.9 mg/dL (ref 1.7–2.4)

## 2019-09-20 LAB — BASIC METABOLIC PANEL
Anion gap: 5 (ref 5–15)
BUN: 9 mg/dL (ref 8–23)
CO2: 20 mmol/L — ABNORMAL LOW (ref 22–32)
Calcium: 6.6 mg/dL — ABNORMAL LOW (ref 8.9–10.3)
Chloride: 121 mmol/L — ABNORMAL HIGH (ref 98–111)
Creatinine, Ser: 0.37 mg/dL — ABNORMAL LOW (ref 0.61–1.24)
GFR calc Af Amer: >60 mL/min (ref >60)
GFR calc non Af Amer: >60 mL/min (ref >60)
Glucose, Bld: 85 mg/dL (ref 70–99)
Potassium: 4.2 mmol/L (ref 3.5–5.1)
Sodium: 146 mmol/L — ABNORMAL HIGH (ref 135–145)

## 2019-09-20 LAB — HEPARIN LEVEL (UNFRACTIONATED)
Heparin Unfractionated: 0.27 IU/mL — ABNORMAL LOW (ref 0.30–0.70)
Heparin Unfractionated: 1.46 IU/mL — ABNORMAL HIGH (ref 0.30–0.70)
Heparin Unfractionated: 0.38 IU/mL (ref 0.30–0.70)

## 2019-09-20 LAB — SURGICAL PATHOLOGY

## 2019-09-20 LAB — PHOSPHORUS: Phosphorus: 2.5 mg/dL (ref 2.5–4.6)

## 2019-09-20 MED ORDER — POTASSIUM CHLORIDE 10 MEQ/100ML IV SOLN
10.0000 meq | INTRAVENOUS | Status: AC
  Administered 2019-09-20 (×4): 10 meq via INTRAVENOUS
  Filled 2019-09-20 (×4): qty 100

## 2019-09-20 MED ORDER — HEPARIN (PORCINE) 25000 UT/250ML-% IV SOLN
1350.0000 [IU]/h | INTRAVENOUS | Status: AC
  Administered 2019-09-20 (×2): 1350 [IU]/h via INTRAVENOUS

## 2019-09-20 MED ORDER — VITAMIN K1 10 MG/ML IJ SOLN
1.0000 mg | Freq: Once | INTRAVENOUS | Status: AC
  Administered 2019-09-20: 1 mg via INTRAVENOUS
  Filled 2019-09-20: qty 0.1

## 2019-09-20 MED ORDER — VITAMIN K1 10 MG/ML IJ SOLN
2.5000 mg | Freq: Once | INTRAVENOUS | Status: AC
  Administered 2019-09-20: 2.5 mg via INTRAVENOUS
  Filled 2019-09-20: qty 0.25

## 2019-09-20 MED ORDER — SODIUM CHLORIDE 0.9 % IV SOLN
INTRAVENOUS | Status: DC | PRN
  Administered 2019-09-20: 250 mL via INTRAVENOUS

## 2019-09-20 MED ORDER — FUROSEMIDE 10 MG/ML IJ SOLN
10.0000 mg | Freq: Once | INTRAMUSCULAR | Status: AC
  Administered 2019-09-20: 10 mg via INTRAVENOUS
  Filled 2019-09-20: qty 4

## 2019-09-20 MED ORDER — DEXTROSE 5 % IV SOLN: INTRAVENOUS | Status: DC

## 2019-09-20 MED ORDER — METOPROLOL TARTRATE 5 MG/5ML IV SOLN
5.0000 mg | Freq: Four times a day (QID) | INTRAVENOUS | Status: DC
  Administered 2019-09-21 – 2019-09-22 (×5): 5 mg via INTRAVENOUS
  Filled 2019-09-20 (×5): qty 5

## 2019-09-20 NOTE — Progress Notes (Signed)
Daily Rounding Note  09/20/2019, 11:21 AM  LOS: 6 days   SUBJECTIVE:   Chief complaint: Dysphagia EGD with potential esophageal dilatation once again delayed due to elevated INR of 2.5 which is essentially the same as its been for the last 5 days. Got low dose of IV Vit K today.     OBJECTIVE:         Vital signs in last 24 hours:    Temp:  [97.6 F (36.4 C)-98.9 F (37.2 C)] 98.3 F (36.8 C) (05/27 0728) Pulse Rate:  [75-87] 87 (05/27 0728) Resp:  [16-20] 16 (05/27 0728) BP: (95-107)/(50-62) 105/54 (05/27 0728) SpO2:  [85 %-93 %] 85 % (05/27 0728) Last BM Date: 09/19/19 Filed Weights   09/04/2019 1118  Weight: 52.2 kg   General: chronically, terminally ill looking.  cachectic   Not re-examined.    Intake/Output from previous day: 05/26 0701 - 05/27 0700 In: 271.5 [I.V.:271.5] Out: 500 [Urine:500]  Intake/Output this shift: No intake/output data recorded.  Lab Results: Recent Labs    09/18/19 0350 09/19/19 0519 09/20/19 0500  WBC 8.6 10.3 9.4  HGB 9.4* 9.6* 9.2*  HCT 31.2* 31.8* 30.3*  PLT 226 251 217   BMET Recent Labs    09/18/19 0350 09/19/19 0519 09/20/19 0602  NA 148* 148* 146*  K 3.4* 3.3* 3.3*  CL 121* 121* 120*  CO2 17* 19* 20*  GLUCOSE 91 105* 153*  BUN _0 CREATININE 0.40* 0.44* 0.43*  CALCIUM 7.5* 7.4* 7.2*   LFT Recent Labs    09/20/19 0602  PROT 3.5*  ALBUMIN 1.2*  AST 15  ALT 12  ALKPHOS 150*  BILITOT 0.8   PT/INR Recent Labs    09/19/19 1300 09/20/19 0500  LABPROT 25.1* 26.5*  INR 2.4* 2.5*   Hepatitis Panel No results for input(s): HEPBSAG, HCVAB, HEPAIGM, HEPBIGM in the last 72 hours.  Studies/Results: DG ESOPHAGUS W SINGLE CM (SOL OR THIN BA)  Result Date: 09/19/2019 CLINICAL DATA:  Esophageal dysphagia. EXAM: ESOPHOGRAM/BARIUM SWALLOW TECHNIQUE: Single contrast examination was performed using  thin barium. FLUOROSCOPY TIME:  Fluoroscopy Time:  3  minutes and 30 seconds Radiation Exposure Index (if provided by the fluoroscopic device): 22.0 mGy Number of Acquired Spot Images: 0 COMPARISON:  Chest CT 09/04/2019 FINDINGS: Limited examination due to piecemeal swallowing. Nonspecific esophageal motility disorder with occasional disruption of the primary peristaltic wave and occasional tertiary contractions. There was moderate esophageal stasis. Long segment of smooth strictured narrowing along the GE junction. Could not exclude esophageal neoplasm. Recommend endoscopic evaluation. IMPRESSION: 1. Nonspecific esophageal motility disorder. 2. Long segment smooth strictured narrowing along the GE junction. Could not exclude esophageal neoplasm. Recommend endoscopic evaluation. Electronically Signed   By: Marijo Sanes M.D.   On: 09/19/2019 13:18    ASSESMENT:   *Dysphagia, weight loss.Unsuccessful attempt to place G-tube at hospital in [place]. 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 Suspicious for malignancy. Nodal biopsy 5/24.? If this is cause for his swallowing issues? Still waiting on path report  *Acute left subclavian DVT  *Atrial fibrillation, mechanical MVR.DVT, PE. Chronic Coumadin,currently on hold with heparin drip in place.. INR 1.8 at arrival but 2.6 today despite no Warfarin since PTA  *Protein calorie malnutrition.  *Normocytic anemia.  * TSH 8.3. No free T4 assay.   *   Hypernatremia.  Hypokalemia.  Both persist.    *  Elevated alk phos, improved .  O/w normal LFTs.     PLAN   *   EGD reset for mid-day tomorrow Would be ideal if MD could place NGT at end of endoscopy since pt is not getting adequate nutrition with the limited PO he takes in.    *   D1, nectar thick diet.  NPO after MN.    *   Vit K 12m IV ordered today, hopefully INR will be less  than or equal to 2 so can perform EGD.        SAzucena Freed 09/20/2019, 11:21 AM Phone [phone]

## 2019-09-20 NOTE — Progress Notes (Addendum)
[place] for heparin Indication: 5/20 Acute L subclavian DVT and hx mechanical mitral valve/Afib (PTA warfarin on hold)  No Known Allergies  Patient Measurements: Height: 5\' 8"  (172.7 cm) Weight: 52.2 kg (115 lb) IBW/kg (Calculated) : 68.4 Heparin Dosing Weight: 52.2 kg  Vital Signs: Temp: 98.3 F (36.8 C) (05/27 0728) Temp Source: Oral (05/27 0728) BP: 105/54 (05/27 0728) Pulse Rate: 87 (05/27 0728)  Labs: . Recent Labs    09/05/2019 1045 09/01/2019 1045 08/25/2019 1116 09/15/19 0229 09/15/19 0300 09/15/19 1349  HGB 9.9*   < > 10.2*  --  8.7*  --   HCT 29.0*  --  33.3*  --  28.2*  --   PLT  --   --  233  --  193  --   APTT  --   --  35  --   --   --   LABPROT  --   --  20.3* 20.6*  --   --   INR  --   --  1.8* 1.8*  --   --   HEPARINUNFRC  --   --   --  0.18*  --  0.24*  CREATININE 0.40*  --  0.48* 0.49*  --   --    < > = values in this interval not displayed.     Estimated Creatinine Clearance: 63.4 mL/min (A) (by C-G formula based on SCr of 0.44 mg/dL (L)).   Medical History: Past Medical History:  Diagnosis Date  . Atrial fibrillation with RVR ([place])   . DVT (deep venous thrombosis) ([place])   . H/O mitral valve replacement with mechanical valve   . Pacemaker 2001  . PE (pulmonary thromboembolism) [facility])     Assessment: 69 year old M on warfarin PTA for atrial fibrillation (CHADS2VASc = 3), mechanical mitral valve and L subclavian DVT seen on outside imaging April 2021 and is still present 09/13/19. Last dose of warfarin 09/13/19, admit INR 1.8, presumed goal is 2.5 -3.5 for mechanical MV. Of note, patient had severe RP bleed in Nov 2020 and has poor PO intake for several months with 35 lb weight loss. Pharmacy consulted to dose heparin while warfarin on hold for multiple procedures.   S/p biopsy 5/24, EGD moved back to 5/28 or when INR < 2.   INR still at 2.5 despite warfarin being on hold. HL subtherapeutic at 0.27 on 1250  units/hr. Discussed with MD to consider giving Vitamin K IV 1mg  with heparin bridge so he can get EGD tomorrow, team to discuss further.   Home warfarin regimen: 2.5mg  MWF, 5mg  TTSS (goal INR should be 2.5 to 3.5)  Goal of Therapy:  Heparin level 0.3-0.7 units/ml   INR goal 2.5 to 3.5 Monitor platelets by anticoagulation protocol: Yes     Plan:  Restart heparin at 1350 units/hr   F/u 8hr HL  Heparin to be held for EGD at 6:30 am 5/28  Team to consider giving Vitamin K IV 1mg  x1  Monitor daily INR, HL, CBC/plt Monitor for signs/symptoms of bleeding    ADDENDUM: Vitamin K 1mg  IV given - GI confirmed EGD is scheduled for 5/28 13:00 and would like heparin to stop at 7:00 am   Benetta Spar, PharmD, BCPS, [facility] Clinical Pharmacist  Please check AMION for all [place] phone numbers After 10:00 PM, call [place]

## 2019-09-20 NOTE — Progress Notes (Addendum)
[place] for Heparin Indication: 5/20 Acute L Subclavian DVT and Hx Mechanical Mitral Valve/Afib (PTA Warfarin on Hold)  No Known Allergies  Patient Measurements: Height: 5\' 8"  (172.7 cm) Weight: 52.2 kg (115 lb) IBW/kg (Calculated) : 68.4 Heparin Dosing Weight: 52.2 kg  Vital Signs: Temp: 98.5 F (36.9 C) (05/27 1944) Temp Source: Oral (05/27 1944) BP: 97/72 (05/27 1944) Pulse Rate: 95 (05/27 1944)  Labs: . Recent Labs    09/13/2019 1045 09/06/2019 1045 09/22/2019 1116 09/15/19 0229 09/15/19 0300 09/15/19 1349  HGB 9.9*   < > 10.2*  --  8.7*  --   HCT 29.0*  --  33.3*  --  28.2*  --   PLT  --   --  233  --  193  --   APTT  --   --  35  --   --   --   LABPROT  --   --  20.3* 20.6*  --   --   INR  --   --  1.8* 1.8*  --   --   HEPARINUNFRC  --   --   --  0.18*  --  0.24*  CREATININE 0.40*  --  0.48* 0.49*  --   --    < > = values in this interval not displayed.    Estimated Creatinine Clearance: 63.4 mL/min (A) (by C-G formula based on SCr of 0.37 mg/dL (L)).   Medical History: Past Medical History:  Diagnosis Date  . Atrial fibrillation with RVR ([place])   . DVT (deep venous thrombosis) ([place])   . H/O mitral valve replacement with mechanical valve   . Pacemaker 2001  . PE (pulmonary thromboembolism) [facility])     Assessment: 69 year old M on warfarin PTA for atrial fibrillation (CHADS2VASc = 3), mechanical mitral valve and L subclavian DVT seen on outside imaging April 2021 and still present 09/13/19. Last dose of warfarin 09/13/19, admit INR 1.8, presumed goal is 2.5 -3.5 for mechanical MV. Of note, patient had severe RP bleed in Nov 2020 and has poor PO intake for several months with 35 lb weight loss. Pharmacy was consulted to dose heparin while warfarin on hold for multiple procedures.   Pt is S/P biopsy 5/24, EGD moved back to 5/28 or when INR < 2.   Despite warfarin being held, INR was still 2.5 this AM; vitamin K 1 mg IV X 1  given. INR was  2.4 at 1900 today; vitamin K 2.5 mg IV X 1 ordered. H/H 9.2/30.3, platelets 217.  Home warfarin regimen: 2.5 mg MWF, 5 mg TTSS (goal INR should be 2.5 to 3.5)  Heparin was restarted this AM at 1350 units/hr. Pt is receiving heparin through central line. Initial heparin level (drawn from central line from which heparin was infusing) was elevated at 1.46 units/ml. Heparin level redrawn via peripheral stick was 0.38 units/ml, which is within the goal range for this pt. Per RN, no issues with heparin IV line or bleeding observed.  GI confirmed EGD is scheduled for 5/28 13:00 and would like heparin to stop at 7:00 AM.   Goal of Therapy:  Heparin level 0.3-0.7 units/ml   INR goal 2.5 to 3.5 Monitor platelets by anticoagulation protocol: Yes    Plan:  Continue heparin at 1350 units/hr   Check confirmatory heparin level in 8 hrs Heparin to be held for EGD at 0700 AM 5/28  Monitor daily INR, heparin level, CBC/plt Monitor for signs/symptoms of bleeding  Gillermina Hu, PharmD, BCPS, [facility] Clinical Pharmacist 09/20/19, 21:40 PM

## 2019-09-20 NOTE — Progress Notes (Signed)
Physical Therapy Wound Treatment Patient Details  Name: Jorge Kennedy MRN: [id number] Date of Birth: 1950/10/23  Today's Date: 09/20/2019 Time: 1023-1105 Time Calculation (min): 42 min  Subjective  Subjective: Pt nonverbal during session Patient and Family Stated Goals: improved wound healing and increased protein intake Prior Treatments: Santyl and dressing changes  Pain Score:  2/10 (premedicated)  Wound Assessment  Pressure Injury 09/15/19 Sacrum Posterior Stage 3 -  Full thickness tissue loss. Subcutaneous fat may be visible but bone, tendon or muscle are NOT exposed. sarum pressure ulcer from facility (Active)  Dressing Type ABD;Barrier Film (skin prep);Gauze (Comment);Moist to moist;Other (Comment) 09/20/19 1712  Dressing Changed;Clean;Dry;Intact 09/20/19 1712  Dressing Change Frequency Daily 09/20/19 1712  State of Healing Eschar 09/20/19 1712  Site / Wound Assessment Brown;Black;Yellow;Red;Pink;Painful;Granulation tissue 09/20/19 1712  % Wound base Red or Granulating 15% 09/20/19 1712  % Wound base Yellow/Fibrinous Exudate 45% 09/20/19 1712  % Wound base Black/Eschar 40% 09/20/19 1712  Peri-wound Assessment Erythema (blanchable);Edema;Pink 09/20/19 1712  Wound Length (cm) 10.6 cm 09/19/19 1000  Wound Width (cm) 8.2 cm 09/19/19 1000  Wound Depth (cm) 1.5 cm 09/19/19 1000  Wound Surface Area (cm^2) 86.92 cm^2 09/19/19 1000  Wound Volume (cm^3) 130.38 cm^3 09/19/19 1000  Undermining (cm) 1 cm 11 o'clock to 4 o'clock 09/17/19 1215  Margins Unattached edges (unapproximated) 09/20/19 1712  Drainage Amount Moderate 09/20/19 1712  Drainage Description Serosanguineous 09/20/19 1712  Treatment Debridement (Selective);Hydrotherapy (Pulse lavage);Packing (Saline gauze) 09/20/19 1712   Santyl applied to wound bed prior to applying dressing.    Hydrotherapy Pulsed lavage therapy - wound location: sacrum Pulsed Lavage with Suction (psi): 12 psi Pulsed Lavage with Suction - Normal  Saline Used: 1000 mL Pulsed Lavage Tip: Tip with splash shield Selective Debridement Selective Debridement - Location: sacrum Selective Debridement - Tools Used: Forceps;Scalpel Selective Debridement - Tissue Removed: softened black eschar and yellow stringy slough   Wound Assessment and Plan  Wound Therapy - Assess/Plan/Recommendations Wound Therapy - Clinical Statement: Pt nonverbal, seemed to tolerate hydrotherapy treatment fairly well. Continued removal of softened eschar and necrotic borders, some areas bleeding easily. Pt will continue to benefit from hydrotherapy for decrease bioburden to promote healing.  Wound Therapy - Functional Problem List: decreased mobility  Factors Delaying/Impairing Wound Healing: Multiple medical problems;Immobility;Other (comment)(protein malnutrition ) Hydrotherapy Plan: Debridement;Pulsatile lavage with suction;Patient/family education;Dressing change Wound Therapy - Frequency: 6X / week Wound Therapy - Follow Up Recommendations: Skilled nursing facility Wound Plan: see above  Wound Therapy Goals- Improve the function of patient's integumentary system by progressing the wound(s) through the phases of wound healing (inflammation - proliferation - remodeling) by: Decrease Necrotic Tissue to: 50 Decrease Necrotic Tissue - Progress: Progressing toward goal Increase Granulation Tissue to: 50 Increase Granulation Tissue - Progress: Progressing toward goal Goals/treatment plan/discharge plan were made with and agreed upon by patient/family: Yes Time For Goal Achievement: 7 days Wound Therapy - Potential for Goals: Fair  Goals will be updated until maximal potential achieved or discharge criteria met.  Discharge criteria: when goals achieved, discharge from hospital, MD decision/surgical intervention, no progress towards goals, refusal/missing three consecutive treatments without notification or medical reason.  GP    Wyona Almas, PT, DPT Acute  Rehabilitation Services Pager [phone] Office [phone]      Deno Etienne 09/20/2019, 5:14 PM

## 2019-09-20 NOTE — Progress Notes (Signed)
Brief GI Progress Update  Reviewed Esophagram. EGD reasonable for exclusion of esophagitis/underlying malignancy/stricturing. Unfortunately, INR still >2.0. He has been postponed until Friday. OK to maintain Heparin. Defer to primary team in regards to INR correction vs continued drift.  Justice Britain, MD [facility] Office # [phone]

## 2019-09-20 NOTE — Progress Notes (Signed)
Called by RN to check patient because patient sats were in the 70's on 6 LPM. Patient was placed on a 15 LPM NRB. Patient SPO2 is now 97%. No distress is noted. Patient BBS were clear; diminished in the bases. Patient did cough to clear throat but did not cough anything up. RT will continue to monitor and if SPO2 remains stable will titrate down as able. RN aware. Family at bedside.

## 2019-09-20 NOTE — Progress Notes (Signed)
Paged regarding increased oxygen requirement, now on non-rebreather.  Evaluated patient at bedside. He was not in distress, breathing comfortably on non-rebreather. Daughter reports he has been somnolent all day and has not waken up to eat or take medications.   On exam, he is somnolent. Lungs with rhonchi, crackles.   A/P  Patient has developed hypoxic respiratory failure. Chest x-ray shows increased perivascular markings and more fluid in the fissure, consistent with volume overload. Blood pressure has been soft throughout admission. He is lasix naive. Will try 1x dose IV lasix 10 mg while monitoring blood pressure closely.   Modena Nunnery D, DO PGY-2 09/20/19, 7:01 PM

## 2019-09-20 NOTE — Progress Notes (Signed)
HD#6 Subjective:  Overnight Events: No overnight events  Jorge Kennedy is resting comfortably in bed.  He does not appear to be in pain.  Patient's daughter is at bedside and states that he has been very sleepy this morning with periods of alertness and fluctuating orientation.  Objective:  Vital signs in last 24 hours: Vitals:   09/19/19 1557 09/19/19 1947 09/19/19 2319 09/20/19 0300  BP: (!) 95/56 107/62 (!) 107/56 (!) 101/50  Pulse: 81 83 81 75  Resp: 20  18 16   Temp: 98.8 F (37.1 C) 97.6 F (36.4 C) 98.9 F (37.2 C) 98.8 F (37.1 C)  TempSrc: Axillary Axillary Axillary Axillary  SpO2: 92% 93% 93% 90%  Weight:      Height:       Supplemental O2: Nasal Cannula SpO2: 90 % O2 Flow Rate (L/min): 3 L/min   Physical Exam:  Physical Exam  Constitutional: He appears malnourished. He appears cachectic.  HENT:  Head: Normocephalic and atraumatic.  Eyes: EOM are normal.  Cardiovascular: Normal rate and intact distal pulses.  Pulmonary/Chest: Effort normal and breath sounds normal. No respiratory distress.  Abdominal: Soft. He exhibits no distension.  Musculoskeletal:        General: No tenderness or edema.     Cervical back: Normal range of motion.  Neurological: He is alert.  Generalized weakness  Skin: Skin is warm and dry.    Filed Weights   09/17/2019 1118  Weight: 52.2 kg     Intake/Output Summary (Last 24 hours) at 09/20/2019 0601 Last data filed at 09/20/2019 0551 Gross per 24 hour  Intake 271.52 ml  Output 400 ml  Net -128.48 ml   Net IO Since Admission: 4,904.51 mL [09/20/19 0601]  Pertinent Labs: CBC Latest Ref Rng & Units 09/20/2019 09/19/2019 09/18/2019  WBC 4.0 - 10.5 K/uL 9.4 10.3 8.6  Hemoglobin 13.0 - 17.0 g/dL 9.2(L) 9.6(L) 9.4(L)  Hematocrit 39.0 - 52.0 % 30.3(L) 31.8(L) 31.2(L)  Platelets 150 - 400 K/uL 217 251 226    CMP Latest Ref Rng & Units 09/19/2019 09/18/2019 09/17/2019  Glucose 70 - 99 mg/dL 105(H) 91 100(H)  BUN 8 - 23 mg/dL 9 10  11   Creatinine 0.61 - 1.24 mg/dL 0.44(L) 0.40(L) 0.51(L)  Sodium 135 - 145 mmol/L 148(H) 148(H) 149(H)  Potassium 3.5 - 5.1 mmol/L 3.3(L) 3.4(L) 3.5  Chloride 98 - 111 mmol/L 121(H) 121(H) 123(H)  CO2 22 - 32 mmol/L 19(L) 17(L) 20(L)  Calcium 8.9 - 10.3 mg/dL 7.4(L) 7.5(L) 7.6(L)  Total Protein 6.5 - 8.1 g/dL - - -  Total Bilirubin 0.3 - 1.2 mg/dL - - -  Alkaline Phos 38 - 126 U/L - - -  AST 15 - 41 U/L - - -  ALT 0 - 44 U/L - - -    Pending Labs: none  Imaging:  Barium Swallow: 1. Nonspecific esophageal motility disorder.  2. Long segment smooth strictured narrowing along the GE junction. Could not exclude esophageal neoplasm. Recommend endoscopic evaluation.   Assessment/Plan:   Principal Problem:   Protein calorie malnutrition (HCC) Active Problems:   Encephalopathy   CAD (coronary artery disease)   Myositis   DVT of axillary vein, chronic left (HCC)   History of pulmonary embolism   History of mitral valve replacement with mechanical valve   Aortic atherosclerosis (HCC)   Suprapubic catheter (HCC)   Bilateral hydronephrosis   Metastatic malignant neoplasm (HCC)   Neck mass   Pressure injury of skin   Protein-calorie malnutrition, severe  Dysphagia    Patient Summary: Jorge Kennedy is a 70 y.o. who  has a past medical history of Atrial fibrillation with RVR ([place]), DVT (deep venous thrombosis) ([place]), H/O mitral valve replacement with mechanical valve, Pacemaker (2001), and PE (pulmonary thromboembolism) ([place])., presented with subacute altered mental status  and admit for cognitive/functional decline and admit for subacute encephalopathy and failure to thrive  on hospital day 6  Subacute Encephalopathy, unknown etiology: Declining functional status, Failure to thrive: Occult Malignancy: Subclavian VTE: - Biopsy of left supraclavicular node pending - Barium swallow concerning for esophageal malignancy - Patient needs an EGD, but INR > 2. Will start vitamin K to  correct it.  Repeat INR this evening and vitamin K  -Appreciate GI assistance comanaging this complex patient.  SevereProtein caloriemalnutrition: Hypernatremia: Hypocalcemia:  Hypokalemia: - Replace electrolytes as needed. - Started D5W for a free water deficit of 1.8L  - Repeat CMP to assess for Ca correction.  - Recheck BMP - Plan for NG placement for feeding  CAD s/p CABGand mechanical mitral valveon chronic warfarin Sick sinus syndromes/p pacemaker HLD -Continue statin for secondary prevention -On heparin for anticoagulation will stop this morning prior to upper endoscopy and restart shortly after.   Atrial fibrillation: -Continue amiodarone 200 mg twice daily and metoprolol 25 mg twice daily -Continue heparin for anticoagulation will stop today for upper endoscopy and begin shortly after that  Sacral decubitus ulcer: -Appreciate wound care's assistance  Diet: dysphagia 1 IVF: D5,35cc/hr VTE: Heparin Code: DNR PT/OT recs: Pending TOC recs: none  Dispo: Anticipated discharge pending further clinical evaluation and management.    [name], D.O. MCIMTP, PGY-1 Date 09/20/2019 Time 6:01 AM Pager: [phone]

## 2019-09-20 NOTE — TOC Initial Note (Signed)
Transition of Care [facility]) - Initial/Assessment Note    Patient Details  Name: Jorge Kennedy MRN: [id number] Date of Birth: 10-16-1950  [facility]) CM/SW Contact:    Geralynn Ochs, LCSW Phone Number: 09/20/2019, 2:33 PM  Clinical Narrative:   CSW met with patient's daughter, Dorian Pod, at bedside to discuss discharge planning. Per Dorian Pod, discharge plan is uncertain at this time due to continued medical workup; they would like answers to know what is wrong and if it is treatable before determining the best route to take for patient's needs at discharge. If the patient's medical condition is still treatable, then they would like to pursue continued rehabilitation at discharge, but if patient is nearing end of life then they would like to take patient home with hospice. CSW provided support and answered questions about both options. Patient's daughter appreciative of information. CSW to continue to follow.                Expected Discharge Plan: Skilled Nursing Facility Barriers to Discharge: Continued Medical Work up   Patient Goals and CMS Choice Patient states their goals for this hospitalization and ongoing recovery are:: patient unable to participate in goal setting due to disorientation CMS Medicare.gov Compare Post Acute Care list provided to:: Patient Represenative (must comment) Choice offered to / list presented to : Adult Children  Expected Discharge Plan and Services Expected Discharge Plan: [place] arrangements for the past 2 months: [place], [place]                                      Prior Living Arrangements/Services Living arrangements for the past 2 months: [place], [place] Lives with:: Self Patient language and need for interpreter reviewed:: No Do you feel safe going back to the place where you live?: Yes      Need for Family Participation in Patient Care:  Yes (Comment) Care giver support system in place?: Yes (comment)   Criminal Activity/Legal Involvement Pertinent to Current Situation/Hospitalization: No - Comment as needed  Activities of Daily Living      Permission Sought/Granted Permission sought to share information with : Family Supports, Chartered certified accountant granted to share information with : Yes, Verbal Permission Granted  Share Information with NAME: Dorian Pod  Permission granted to share info w AGENCY: SNF  Permission granted to share info w Relationship: Daughter/POA     Emotional Assessment Appearance:: Appears stated age Attitude/Demeanor/Rapport: Unable to Assess Affect (typically observed): Unable to Assess Orientation: : Oriented to Self Alcohol / Substance Use: Not Applicable Psych Involvement: No (comment)  Admission diagnosis:  Neck mass [R22.1] Encephalopathy [G93.40] Metastatic malignant neoplasm, unspecified site ([place]) [C79.9] Malnutrition, unspecified type [facility]) [E46] Patient Active Problem List   Diagnosis Date Noted  . Dysphagia   . Protein-calorie malnutrition, severe 09/18/2019  . Pressure injury of skin 09/17/2019  . Myositis 09/15/2019  . DVT of axillary vein, chronic left ([place]) 09/15/2019  . History of pulmonary embolism 09/15/2019  . History of mitral valve replacement with mechanical valve 09/15/2019  . Aortic atherosclerosis ([place]) 09/15/2019  . Suprapubic catheter ([place]) 09/15/2019  . Bilateral hydronephrosis 09/15/2019  . Metastatic malignant neoplasm ([place])   . Neck mass   . Encephalopathy 09/01/2019  . Protein calorie malnutrition ([place]) 08/28/2019  . CAD (coronary artery disease) 09/17/2019   PCP:  System, Pcp Not In Pharmacy:  No Pharmacies Listed    Social Determinants of Health (SDOH) Interventions    Readmission Risk Interventions No flowsheet data found.

## 2019-09-21 ENCOUNTER — Encounter (HOSPITAL_COMMUNITY): Admission: EM | Disposition: E | Payer: Self-pay | Source: Home / Self Care | Attending: Internal Medicine

## 2019-09-21 DIAGNOSIS — K769 Liver disease, unspecified: Secondary | ICD-10-CM

## 2019-09-21 DIAGNOSIS — Z7901 Long term (current) use of anticoagulants: Secondary | ICD-10-CM

## 2019-09-21 DIAGNOSIS — C770 Secondary and unspecified malignant neoplasm of lymph nodes of head, face and neck: Secondary | ICD-10-CM

## 2019-09-21 DIAGNOSIS — J9601 Acute respiratory failure with hypoxia: Secondary | ICD-10-CM

## 2019-09-21 DIAGNOSIS — C159 Malignant neoplasm of esophagus, unspecified: Secondary | ICD-10-CM

## 2019-09-21 DIAGNOSIS — Z515 Encounter for palliative care: Secondary | ICD-10-CM

## 2019-09-21 DIAGNOSIS — Z952 Presence of prosthetic heart valve: Secondary | ICD-10-CM

## 2019-09-21 DIAGNOSIS — R1310 Dysphagia, unspecified: Secondary | ICD-10-CM

## 2019-09-21 DIAGNOSIS — Z7189 Other specified counseling: Secondary | ICD-10-CM

## 2019-09-21 DIAGNOSIS — C7951 Secondary malignant neoplasm of bone: Secondary | ICD-10-CM

## 2019-09-21 DIAGNOSIS — C799 Secondary malignant neoplasm of unspecified site: Secondary | ICD-10-CM

## 2019-09-21 DIAGNOSIS — I2510 Atherosclerotic heart disease of native coronary artery without angina pectoris: Secondary | ICD-10-CM

## 2019-09-21 DIAGNOSIS — Z951 Presence of aortocoronary bypass graft: Secondary | ICD-10-CM

## 2019-09-21 DIAGNOSIS — Z950 Presence of cardiac pacemaker: Secondary | ICD-10-CM

## 2019-09-21 DIAGNOSIS — R627 Adult failure to thrive: Secondary | ICD-10-CM

## 2019-09-21 LAB — CBC
HCT: 30.1 % — ABNORMAL LOW (ref 39.0–52.0)
Hemoglobin: 9.2 g/dL — ABNORMAL LOW (ref 13.0–17.0)
MCH: 28.1 pg (ref 26.0–34.0)
MCHC: 30.6 g/dL (ref 30.0–36.0)
MCV: 92 fL (ref 80.0–100.0)
Platelets: 228 10*3/uL (ref 150–400)
RBC: 3.27 MIL/uL — ABNORMAL LOW (ref 4.22–5.81)
RDW: 22.9 % — ABNORMAL HIGH (ref 11.5–15.5)
WBC: 8.3 10*3/uL (ref 4.0–10.5)
nRBC: 0 % (ref 0.0–0.2)

## 2019-09-21 LAB — URINALYSIS, ROUTINE W REFLEX MICROSCOPIC
Bilirubin Urine: NEGATIVE
Glucose, UA: NEGATIVE mg/dL
Hgb urine dipstick: NEGATIVE
Ketones, ur: NEGATIVE mg/dL
Leukocytes,Ua: NEGATIVE
Nitrite: NEGATIVE
Protein, ur: NEGATIVE mg/dL
Specific Gravity, Urine: 1.015 (ref 1.005–1.030)
pH: 5 (ref 5.0–8.0)

## 2019-09-21 LAB — COMPREHENSIVE METABOLIC PANEL
ALT: 13 U/L (ref 0–44)
AST: 18 U/L (ref 15–41)
Albumin: 1.2 g/dL — ABNORMAL LOW (ref 3.5–5.0)
Alkaline Phosphatase: 154 U/L — ABNORMAL HIGH (ref 38–126)
Anion gap: 5 (ref 5–15)
BUN: 10 mg/dL (ref 8–23)
CO2: 20 mmol/L — ABNORMAL LOW (ref 22–32)
Calcium: 7.3 mg/dL — ABNORMAL LOW (ref 8.9–10.3)
Chloride: 119 mmol/L — ABNORMAL HIGH (ref 98–111)
Creatinine, Ser: 0.43 mg/dL — ABNORMAL LOW (ref 0.61–1.24)
GFR calc Af Amer: >60 mL/min (ref >60)
GFR calc non Af Amer: >60 mL/min (ref >60)
Glucose, Bld: 98 mg/dL (ref 70–99)
Potassium: 3.3 mmol/L — ABNORMAL LOW (ref 3.5–5.1)
Sodium: 144 mmol/L (ref 135–145)
Total Bilirubin: 0.3 mg/dL (ref 0.3–1.2)
Total Protein: 3.7 g/dL — ABNORMAL LOW (ref 6.5–8.1)

## 2019-09-21 LAB — HEPARIN LEVEL (UNFRACTIONATED): Heparin Unfractionated: <0.1 IU/mL — ABNORMAL LOW (ref 0.30–0.70)

## 2019-09-21 LAB — MAGNESIUM: Magnesium: 1.7 mg/dL (ref 1.7–2.4)

## 2019-09-21 LAB — PROTIME-INR
INR: 1.4 — ABNORMAL HIGH (ref 0.8–1.2)
Prothrombin Time: 16.5 seconds — ABNORMAL HIGH (ref 11.4–15.2)

## 2019-09-21 SURGERY — CANCELLED PROCEDURE

## 2019-09-21 MED ORDER — MORPHINE BOLUS VIA INFUSION
1.0000 mg | INTRAVENOUS | Status: DC | PRN
  Filled 2019-09-21: qty 1

## 2019-09-21 MED ORDER — VANCOMYCIN HCL IN DEXTROSE 1-5 GM/200ML-% IV SOLN
1000.0000 mg | INTRAVENOUS | Status: DC
  Administered 2019-09-21: 1000 mg via INTRAVENOUS
  Filled 2019-09-21 (×2): qty 200

## 2019-09-21 MED ORDER — GLYCOPYRROLATE 0.2 MG/ML IJ SOLN: 0.4000 mg | INTRAMUSCULAR | Status: DC | PRN

## 2019-09-21 MED ORDER — HEPARIN (PORCINE) 25000 UT/250ML-% IV SOLN
1400.0000 [IU]/h | INTRAVENOUS | Status: DC
  Administered 2019-09-21: 1400 [IU]/h via INTRAVENOUS
  Filled 2019-09-21: qty 250

## 2019-09-21 MED ORDER — ONDANSETRON HCL 4 MG/2ML IJ SOLN: 4.0000 mg | Freq: Four times a day (QID) | INTRAMUSCULAR | Status: DC | PRN

## 2019-09-21 MED ORDER — MORPHINE 100MG IN NS 100ML (1MG/ML) PREMIX INFUSION
4.0000 mg/h | INTRAVENOUS | Status: DC
  Administered 2019-09-21: 1 mg/h via INTRAVENOUS
  Filled 2019-09-21 (×2): qty 100

## 2019-09-21 MED ORDER — MORPHINE SULFATE (PF) 2 MG/ML IV SOLN
2.0000 mg | Freq: Once | INTRAVENOUS | Status: AC
  Administered 2019-09-21: 2 mg via INTRAVENOUS
  Filled 2019-09-21: qty 1

## 2019-09-21 MED ORDER — MAGIC MOUTHWASH
15.0000 mL | Freq: Four times a day (QID) | ORAL | Status: DC | PRN
  Filled 2019-09-21: qty 15

## 2019-09-21 MED ORDER — LORAZEPAM 2 MG/ML IJ SOLN
0.5000 mg | INTRAMUSCULAR | Status: DC | PRN
  Administered 2019-09-22 – 2019-09-23 (×2): 1 mg via INTRAVENOUS
  Filled 2019-09-21 (×2): qty 1

## 2019-09-21 MED ORDER — BISACODYL 10 MG RE SUPP: 10.0000 mg | Freq: Every day | RECTAL | Status: DC | PRN

## 2019-09-21 MED ORDER — PIPERACILLIN-TAZOBACTAM 3.375 G IVPB
3.3750 g | Freq: Three times a day (TID) | INTRAVENOUS | Status: DC
  Administered 2019-09-21: 3.375 g via INTRAVENOUS
  Filled 2019-09-21: qty 50

## 2019-09-21 MED ORDER — BIOTENE DRY MOUTH MT LIQD: 15.0000 mL | OROMUCOSAL | Status: DC | PRN

## 2019-09-21 MED ORDER — CHLORHEXIDINE GLUCONATE CLOTH 2 % EX PADS
6.0000 | MEDICATED_PAD | Freq: Every day | CUTANEOUS | Status: DC
  Administered 2019-09-21: 6 via TOPICAL

## 2019-09-21 MED ORDER — POTASSIUM CHLORIDE 10 MEQ/100ML IV SOLN
10.0000 meq | INTRAVENOUS | Status: AC
  Administered 2019-09-21 (×4): 10 meq via INTRAVENOUS
  Filled 2019-09-21 (×4): qty 100

## 2019-09-21 MED ORDER — VANCOMYCIN HCL IN DEXTROSE 1-5 GM/200ML-% IV SOLN: 1000.0000 mg | Freq: Once | INTRAVENOUS | Status: DC

## 2019-09-21 SURGICAL SUPPLY — 14 items

## 2019-09-21 NOTE — Progress Notes (Signed)
[place] for heparin Indication: 5/20 Acute L subclavian DVT and hx mechanical mitral valve/Afib (PTA warfarin on hold)  No Known Allergies  Patient Measurements: Height: 5\' 8"  (172.7 cm) Weight: 52.2 kg (115 lb) IBW/kg (Calculated) : 68.4 Heparin Dosing Weight: 52.2 kg  Vital Signs: Temp: 99 F (37.2 C) (05/28 0846) Temp Source: Axillary (05/28 0846) BP: 102/65 (05/28 0846) Pulse Rate: 75 (05/28 0846)  Labs: . Recent Labs    08/28/2019 1045 09/02/2019 1045 08/25/2019 1116 09/15/19 0229 09/15/19 0300 09/15/19 1349  HGB 9.9*   < > 10.2*  --  8.7*  --   HCT 29.0*  --  33.3*  --  28.2*  --   PLT  --   --  233  --  193  --   APTT  --   --  35  --   --   --   LABPROT  --   --  20.3* 20.6*  --   --   INR  --   --  1.8* 1.8*  --   --   HEPARINUNFRC  --   --   --  0.18*  --  0.24*  CREATININE 0.40*  --  0.48* 0.49*  --   --    < > = values in this interval not displayed.     Estimated Creatinine Clearance: 63.4 mL/min (A) (by C-G formula based on SCr of 0.43 mg/dL (L)).   Medical History: Past Medical History:  Diagnosis Date  . Atrial fibrillation with RVR ([place])   . DVT (deep venous thrombosis) ([place])   . H/O mitral valve replacement with mechanical valve   . Pacemaker 2001  . PE (pulmonary thromboembolism) [facility])     Assessment: 69 year old M on warfarin PTA for atrial fibrillation (CHADS2VASc = 3), mechanical mitral valve and L subclavian DVT seen on outside imaging April 2021 and is still present 09/13/19. Last dose of warfarin 09/13/19, admit INR 1.8, presumed goal is 2.5 -3.5 for mechanical MV. Of note, patient had severe RP bleed in Nov 2020 and has poor PO intake for several months with 35 lb weight loss. Pharmacy consulted to dose heparin while warfarin on hold for multiple procedures.   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 INR down to 1.4 after vitamin K 3.5mg  IV but EGD is  cancelled due to unclear utility with path results. Per discussion with MD, restart heparin now. Patient was on low end of therapeutic on 1350 units/hr. Note RN drawing heparin levels from PICC line where heparin is infusing. Added instructions to hold heparin for 5 min and flush line thoroughly before drawing.  Home warfarin regimen: 2.5mg  MWF, 5mg  TTSS (goal INR should be 2.5 to 3.5)  Goal of Therapy:  Heparin level 0.5-0.7 units/ml (target higher end given mech valve and acute DVT)    INR goal 2.5 to 3.5 Monitor platelets by anticoagulation protocol: Yes     Plan:  Restart heparin at 1400 units/hr   F/u 8hr HL  Monitor daily INR, HL, CBC/plt Monitor for signs/symptoms of bleeding   Benetta Spar, PharmD, BCPS, BCCP Clinical Pharmacist  Please check AMION for all [place] phone numbers After 10:00 PM, call [place]

## 2019-09-21 NOTE — Progress Notes (Signed)
Pharmacy Antibiotic Note  Jorge Kennedy is a 69 y.o. male admitted on 09/17/2019 with possible new malignancy. Pharmacy has been consulted for vancomycin and pipercillin/tazobactam dosing for potential PNA given acute hypoxic respiratory failure.  WBC wnl, Afebrile. Recent ESBL Klebsiella UTI s/p treatment.   CXR: Increased bilateral diffuse lung opacities concerning for worsening edema or possibly inflammation. Stable mild left pleural effusion  5/28 Vancomycin 1000mg  Q 24 hr Scr used: 0.8 mg/dL Weight: 52 kg Vd coeff: 0.72 L/kg Est AUC: 470  Plan: Vancomycin 1g Q24hr  Pip/tazo 3.375g q8 hr Monitor cultures, MRSA screen, clinical status, renal fx Narrow abx as able and f/u duration     Height: 5\' 8"  (172.7 cm) Weight: 52.2 kg (115 lb) IBW/kg (Calculated) : 68.4  Temp (24hrs), Avg:98.4 F (36.9 C), Min:97.5 F (36.4 C), Max:99 F (37.2 C)  Recent Labs  Lab 09/17/19 0458 09/17/19 0458 09/18/19 0350 09/19/19 0519 09/20/19 0500 09/20/19 0602 09/20/19 1900 08/30/2019 0436  WBC 8.5  --  8.6 10.3 9.4  --   --  8.3  CREATININE 0.51*   < > 0.40* 0.44*  --  0.43* 0.37* 0.43*   < > = values in this interval not displayed.    Estimated Creatinine Clearance: 63.4 mL/min (A) (by C-G formula based on SCr of 0.43 mg/dL (L)).    No Known Allergies  Antimicrobials this admission: Vanc  5/28 >>  Pip/tazo 5/28 >>   Ertapenem 5/20  >>5/25 Fluconazole 5/23 >> 5/28  Microbiology results: 5/28 BCx: pend 5/28 MRSA pcr: pend 5/11 UCx at OSH: ESBL K PNA (S-erta/mero/macrobid/zosyn)  Thank you for allowing pharmacy to be a part of this patient's care.  Benetta Spar, PharmD, BCPS, BCCP Clinical Pharmacist  Please check AMION for all [place] phone numbers After 10:00 PM, call [place] [phone]

## 2019-09-21 NOTE — Progress Notes (Signed)
[place]  [phone]   [place]   Kenet Viscardi  DOB: November 15, 1950  MR#: [id number]  CSN#: [id number]    Requesting Physician: [facility]  Patient Care Team: System, Pcp Not In as PCP - General  Reason for consult: Metastatic squamous cell carcinoma  History of present illness:   Patient is a 69 year old Caucasian male, with past medical history of coronary artery disease, status post CABG and mechanical valve placement, pacemaker, heavy smoker, presented with progressive dysphagia, weight loss.  He was admitted to [facility] on Sep 14, 2019.  His work-up showed metastatic squamous cell carcinoma, I was called to evaluate patient and discuss prognosis and treatment options.  Patient is on nonrebreather, very lethargic, I spoke with his daughter Dorian Pod at the bedside.  Apparently he started noticing dysphagia with solids and liquid around Thanksgiving last year, it has progressively getting worse and he has significant weight loss.  He was hospitalized in [place] in March 2021 for failure to thrive.  Work-up showed left upper extremity DVT, small PE, normal EF.  Per patient's daughter, he underwent EGD and PEG tube placement but was unsuccessful.  Gastric biopsy was negative for malignancy.  Patient was discharged to a nursing home in [place] a few weeks ago where his daughter Dorian Pod lives, and Zenia Resides brought his father to [facility] for further evaluation and management due to the ongoing weight loss and fatigue.   Patient underwent left supraclavicular lymph node biopsy at [facility], which showed poorly differentiated carcinoma, favor squamous cell carcinoma.  GI was consulted and EGD was planned for today, unfortunately patient became hypoxic last night, required nonrebreather and became lethargic.  EGD was held.  Patient was made a DNR.  MEDICAL HISTORY:  Past Medical History:  Diagnosis  Date  . Atrial fibrillation with RVR ([place])   . DVT (deep venous thrombosis) ([place])   . H/O mitral valve replacement with mechanical valve   . Pacemaker 2001  . PE (pulmonary thromboembolism) ([place])     SURGICAL HISTORY: Past Surgical History:  Procedure Laterality Date  . MITRAL VALVE REPLACEMENT  2001    SOCIAL HISTORY: Social History   Socioeconomic History  . Marital status: Widowed    Spouse name: Not on file  . Number of children: Not on file  . Years of education: Not on file  . Highest education level: Not on file  Occupational History  . Not on file  Tobacco Use  . Smoking status: Not on file  Substance and Sexual Activity  . Alcohol use: Not on file  . Drug use: Not on file  . Sexual activity: Not on file  Other Topics Concern  . Not on file  Social History Narrative  . Not on file   Social Determinants of Health   Financial Resource Strain:   . Difficulty of Paying Living Expenses:   Food Insecurity:   . Worried About Charity fundraiser in the Last Year:   . Arboriculturist in the Last Year:   Transportation Needs:   . Film/video editor (Medical):   Marland Kitchen Lack of Transportation (Non-Medical):   Physical Activity:   . Days of Exercise per Week:   . Minutes of Exercise per Session:   Stress:   . Feeling of Stress :   Social Connections:   . Frequency of Communication with Friends and Family:   . Frequency of Social Gatherings with Friends and  Family:   . Attends Religious Services:   . Active Member of Clubs or Organizations:   . Attends Archivist Meetings:   Marland Kitchen Marital Status:   Intimate Partner Violence:   . Fear of Current or Ex-Partner:   . Emotionally Abused:   Marland Kitchen Physically Abused:   . Sexually Abused:     FAMILY HISTORY: History reviewed. No pertinent family history.  ALLERGIES:  has No Known Allergies.  MEDICATIONS:  Current Facility-Administered Medications  Medication Dose Route Frequency Provider Last Rate Last Admin   . 0.9 %  sodium chloride infusion   Intravenous PRN Velna Ochs, MD   Stopped at 09/08/2019 0305  . amiodarone (PACERONE) tablet 200 mg  200 mg Oral BID Bloomfield, Carley D, DO   200 mg at 09/20/2019 0943  . atorvastatin (LIPITOR) tablet 80 mg  80 mg Oral Daily Bloomfield, Carley D, DO   80 mg at 09/10/2019 0943  . Chlorhexidine Gluconate Cloth 2 % PADS 6 each  6 each Topical Daily Lucious Groves, DO   6 each at 09/22/2019 1200  . collagenase (SANTYL) ointment   Topical Daily Lucious Groves, DO   Given at 09/04/2019 [phone]  . FLUoxetine (PROZAC) capsule 10 mg  10 mg Oral Daily Bloomfield, Carley D, DO   10 mg at 08/27/2019 0943  . Gerhardt's butt cream   Topical PRN Joni Reining C, DO   1 application at 0000000 1440  . heparin ADULT infusion 100 units/mL (25000 units/277mL sodium chloride 0.45%)  1,400 Units/hr Intravenous Continuous Donnamae Jude, RPH 14 mL/hr at 09/13/2019 0957 1,400 Units/hr at 09/08/2019 0957  . HYDROmorphone (DILAUDID) injection 0.5-1 mg  0.5-1 mg Intravenous Q2H PRN Bloomfield, Carley D, DO   1 mg at 09/14/2019 1004  . melatonin tablet 10 mg  10 mg Oral QHS Velna Ochs, MD   10 mg at 09/20/19 2221  . metoprolol tartrate (LOPRESSOR) injection 5 mg  5 mg Intravenous Q6H Bloomfield, Carley D, DO   5 mg at 09/17/2019 1227  . nicotine (NICODERM CQ - dosed in mg/24 hours) patch 21 mg  21 mg Transdermal Daily Bloomfield, Carley D, DO   21 mg at 08/30/2019 0943  . piperacillin-tazobactam (ZOSYN) IVPB 3.375 g  3.375 g Intravenous Q8H Benetta Spar D, RPH 12.5 mL/hr at 09/09/2019 1400 3.375 g at 09/11/2019 1400  . Resource ThickenUp Clear   Oral PRN Lucious Groves, DO      . senna-docusate (Senokot-S) tablet 1 tablet  1 tablet Oral Daily PRN Bloomfield, Carley D, DO      . sodium chloride flush (NS) 0.9 % injection 10-40 mL  10-40 mL Intracatheter Q12H Hoffman, Erik C, DO   10 mL at 09/11/2019 0944  . sodium chloride flush (NS) 0.9 % injection 10-40 mL  10-40 mL Intracatheter PRN Lucious Groves, DO       . vancomycin (VANCOCIN) IVPB 1000 mg/200 mL premix  1,000 mg Intravenous Q24H Donnamae Jude, RPH 200 mL/hr at 09/07/2019 1402 1,000 mg at 09/02/2019 1402    REVIEW OF SYSTEMS:   Patient is very lethargic, unable to obtain.   PHYSICAL EXAMINATION: ECOG PERFORMANCE STATUS: 4 - Bedbound  Vitals:   08/29/2019 1303 08/30/2019 1603  BP: 111/73 (!) 91/54  Pulse: 78 75  Resp: 20 20  Temp: (!) 100.7 F (38.2 C) 99.2 F (37.3 C)  SpO2: 95% 95%   Filed Weights   09/22/2019 1118  Weight: 115 lb (52.2 kg)    GENERAL:alert,  no distress and comfortable SKIN: skin color, texture, turgor are normal, no rashes or significant lesions EYES: normal, conjunctiva are pink and non-injected, sclera clear OROPHARYNX:no exudate, no erythema and lips, buccal mucosa, and tongue normal  NECK: supple, thyroid normal size, non-tender, without nodularity LYMPH:  no palpable lymphadenopathy in the cervical, axillary or inguinal LUNGS: clear to auscultation and percussion with normal breathing effort HEART: regular rate & rhythm and no murmurs and no lower extremity edema ABDOMEN:abdomen soft, non-tender and normal bowel sounds Musculoskeletal:no cyanosis of digits and no clubbing  PSYCH: alert & oriented x 3 with fluent speech NEURO: no focal motor/sensory deficits  LABORATORY DATA:  I have reviewed the data as listed Lab Results  Component Value Date   WBC 8.3 08/31/2019   HGB 9.2 (L) 09/04/2019   HCT 30.1 (L) 09/13/2019   MCV 92.0 09/07/2019   PLT 228 08/31/2019   Recent Labs    09/15/19 0229 09/16/19 0429 09/20/19 0602 09/20/19 1900 08/26/2019 0436  NA 145   < > 146* 146* 144  K 3.3*   < > 3.3* 4.2 3.3*  CL 117*   < > 120* 121* 119*  CO2 20*   < > 20* 20* 20*  GLUCOSE 74   < > 153* 85 98  BUN 17   < > 10 9 10   CREATININE 0.49*   < > 0.43* 0.37* 0.43*  CALCIUM 7.7*   < > 7.2* 6.6* 7.3*  GFRNONAA NOT CALCULATED   < > >60 >60 >60  GFRAA NOT CALCULATED   < > >60 >60 >60  PROT 4.1*  --  3.5*   --  3.7*  ALBUMIN 1.6*  --  1.2*  --  1.2*  AST 18  --  15  --  18  ALT 18  --  12  --  13  ALKPHOS 171*  --  150*  --  154*  BILITOT 0.5  --  0.8  --  0.3   < > = values in this interval not displayed.    RADIOGRAPHIC STUDIES: I have personally reviewed the radiological images as listed and agreed with the findings in the report. DG Chest 1 View  Result Date: 09/20/2019 CLINICAL DATA:  Shortness of breath. EXAM: CHEST  1 VIEW COMPARISON:  Sep 15, 2019. FINDINGS: Stable cardiomediastinal silhouette. Left-sided pacemaker is unchanged in position. Status post cardiac valve repair. Atherosclerosis of thoracic aorta is noted. Right-sided PICC line is unchanged in position. No pneumothorax is noted. Increased bilateral diffuse lung opacities are noted concerning for worsening edema or possibly inflammation. Stable mild left pleural effusion is noted. Bony thorax is unremarkable. IMPRESSION: Aortic atherosclerosis. Increased bilateral diffuse lung opacities are noted concerning for worsening edema or possibly inflammation. Stable mild left pleural effusion is noted. Aortic Atherosclerosis (ICD10-I70.0). Electronically Signed   By: Marijo Conception M.D.   On: 09/20/2019 17:12   CT Soft Tissue Neck W Contrast  Result Date: 09/14/2019 CLINICAL DATA:  Left neck mass. Confusion and slurred speech. On Coumadin EXAM: CT NECK WITH CONTRAST TECHNIQUE: Multidetector CT imaging of the neck was performed using the standard protocol following the bolus administration of intravenous contrast. CONTRAST:  159mL OMNIPAQUE IOHEXOL 300 MG/ML  SOLN COMPARISON:  None. FINDINGS: Pharynx and larynx: Normal. No mass or swelling. Salivary glands: No inflammation, mass, or stone. Thyroid: Negative Lymph nodes: Left supraclavicular soft tissue mass measuring 3.5 x 4 cm with adjacent surrounding stranding and soft tissue densities. This may represent malignant adenopathy. Extensive stranding  in the fat in the lower neck and axilla on  the left. No enlarged lymph nodes in the right neck. Vascular: Left sided pacemaker. Catheter in the right subclavian vein compatible with PICC line. There is extensive thrombosis of the left subclavian vein. Small amount of thrombus extends into the lower left jugular vein. Atherosclerotic calcification in the aortic arch and carotid bifurcation. Limited intracranial: Negative Visualized orbits: Not significantly evaluated. Mastoids and visualized paranasal sinuses: Paranasal sinuses clear. Mastoid clear. Skeleton: Multiple small sclerotic lesions are seen in the cervical and thoracic spine and in the manubrium. Appearance most consistent with metastatic disease. Correlate with prostate cancer. Mild compression fracture T5 appears chronic. Cervical spondylosis C4-5 C5-6 and C6-7. Upper chest: Chest CT from today reported separately Other: Extensive soft tissue swelling in the posterior left upper back extending into the lower neck. Portions of the swelling show high density consistent with hemorrhage. There also are punctate calcifications within the soft tissue swelling of uncertain etiology, but may be related to dystrophic calcification in tumor. There is stranding in the fat in the left lateral and lower neck and left axilla. Soft tissue mass in the left supraclavicular fossa may be a conglomeration of enlarged lymph nodes. IMPRESSION: 1. Left subclavian vein thrombosis. Small amount of thrombus extends into the lower left jugular vein. The patient has a left-sided pacemaker. 2. Large ill-defined soft tissue mass in the left upper back extending into the lower neck and left axilla. This has ill-defined margins and stranding in the adjacent fat. There appears to be hemorrhage within the mass. This is most likely due to malignancy/metastatic disease with hemorrhage. Left supraclavicular mass most consistent with malignant adenopathy. 3. 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 4.  These results were called by telephone at the time of interpretation on 09/16/2019 at 3:09 pm to provider [facility] , who verbally acknowledged these results. Electronically Signed   By: Franchot Gallo M.D.   On: 08/28/2019 15:10   CT CHEST W CONTRAST  Result Date: 09/04/2019 CLINICAL DATA:  Confusion and slurred speech. EXAM: CT CHEST, ABDOMEN, AND PELVIS WITH CONTRAST TECHNIQUE: Multidetector CT imaging of the chest, abdomen and pelvis was performed following the standard protocol during bolus administration of intravenous contrast. CONTRAST:  100 mL OMNIPAQUE IOHEXOL 300 MG/ML  SOLN COMPARISON:  None. FINDINGS: CT CHEST FINDINGS Cardiovascular: The patient is status post CABG with a pacing device in place. Heart size is normal. No pericardial effusion. Mediastinum/Nodes: No enlarged mediastinal, hilar, or axillary lymph nodes. Thyroid gland, trachea, and esophagus demonstrate no significant findings. Lungs/Pleura: Small bilateral pleural effusions are seen, larger on the right. Lungs demonstrate some emphysematous disease. Dependent atelectasis is worse on the left. No nodule, mass or consolidative process. Musculoskeletal: Scattered small sclerotic lesions are identified, most prominent in T1 and T2. There also scattered lucent lesions in the spine. There is edema in the left posterior paraspinous and rotator cuff muscle bellies. Edema is also seen in the left latissimus dorsi. CT ABDOMEN PELVIS FINDINGS Hepatobiliary: A 2.5 cm in diameter hypoattenuating lesion is seen in the right hepatic lobe on image 57. The lesion has overall density units of 18.3 and scattered small calcifications within it. A second hypoattenuating lesion in the left hepatic lobe measures 0.6 cm on image 58 and has density units 16.8. There is a third small lesion in the posterior right hepatic lobe on image 46. These cannot be definitively characterized. High attenuation within the gallbladder could be due to sludge or vicarious  excretion of contrast. Biliary tree is unremarkable. Pancreas: Unremarkable. No pancreatic ductal dilatation or surrounding inflammatory changes. Spleen: Normal in size without focal abnormality. Adrenals/Urinary Tract: The adrenal glands appear normal. 4 cm right renal cyst is noted. The patient has moderate to moderately severe bilateral hydroureteronephrosis. No stone is identified. The urinary bladder is decompressed with a Foley catheter in place. Stomach/Bowel: Stomach is within normal limits. Small hiatal hernia noted. The appendix is not visualized. No evidence of bowel wall thickening, distention, or inflammatory changes. Vascular/Lymphatic: Extensive atherosclerosis.  No aneurysm. Reproductive: Marked prostatomegaly. Other: There is extensive body wall edema and a small volume of abdominal and pelvic ascites. Musculoskeletal: Multifocal muscle edema is present and worst in the right gluteal musculature, left obturator internus and externus and pectineus muscles. There is also edema in the proximal thigh muscles, much worse on the right. Multifocal areas of dystrophic calcification are present in muscle, worst in the thighs and right buttock. Bones demonstrate scattered punctate lesions. There is a mild superior endplate compression fracture T12. There also are multiple areas of bony lucency. The L1 vertebral body appears mildly expanded. IMPRESSION: Scattered sclerotic and likely lytic lesions throughout the thoracic and lumbar spine worrisome for neoplastic process. Primary lesion is not identified. MRI of the thoracic spine with and without contrast is recommended for further evaluation. 3 hypoattenuating lesions in the liver cannot be definitively characterized. MRI with and without contrast could be used for further evaluation. Based on imaging findings, MRI of the thoracic and lumbar spine is likely to be higher yield initially for cancer. Multifocal muscle edema and calcifications cannot be  definitively characterized but are most worrisome for polymyositis. Consider consult with rheumatology and/or muscle biopsy. Moderate bilateral hydronephrosis is likely due to marked prostatomegaly. Anasarca with small bilateral pleural effusions and small volume of abdominal and pelvic ascites. Aortic Atherosclerosis (ICD10-I70.0) and Emphysema (ICD10-J43.9). Electronically Signed   By: Inge Rise M.D.   On: 09/03/2019 15:29   CT ABDOMEN PELVIS W CONTRAST  Result Date: 09/01/2019 CLINICAL DATA:  Confusion and slurred speech. EXAM: CT CHEST, ABDOMEN, AND PELVIS WITH CONTRAST TECHNIQUE: Multidetector CT imaging of the chest, abdomen and pelvis was performed following the standard protocol during bolus administration of intravenous contrast. CONTRAST:  100 mL OMNIPAQUE IOHEXOL 300 MG/ML  SOLN COMPARISON:  None. FINDINGS: CT CHEST FINDINGS Cardiovascular: The patient is status post CABG with a pacing device in place. Heart size is normal. No pericardial effusion. Mediastinum/Nodes: No enlarged mediastinal, hilar, or axillary lymph nodes. Thyroid gland, trachea, and esophagus demonstrate no significant findings. Lungs/Pleura: Small bilateral pleural effusions are seen, larger on the right. Lungs demonstrate some emphysematous disease. Dependent atelectasis is worse on the left. No nodule, mass or consolidative process. Musculoskeletal: Scattered small sclerotic lesions are identified, most prominent in T1 and T2. There also scattered lucent lesions in the spine. There is edema in the left posterior paraspinous and rotator cuff muscle bellies. Edema is also seen in the left latissimus dorsi. CT ABDOMEN PELVIS FINDINGS Hepatobiliary: A 2.5 cm in diameter hypoattenuating lesion is seen in the right hepatic lobe on image 57. The lesion has overall density units of 18.3 and scattered small calcifications within it. A second hypoattenuating lesion in the left hepatic lobe measures 0.6 cm on image 58 and has density  units 16.8. There is a third small lesion in the posterior right hepatic lobe on image 46. These cannot be definitively characterized. High attenuation within the gallbladder could be due to sludge or vicarious excretion  of contrast. Biliary tree is unremarkable. Pancreas: Unremarkable. No pancreatic ductal dilatation or surrounding inflammatory changes. Spleen: Normal in size without focal abnormality. Adrenals/Urinary Tract: The adrenal glands appear normal. 4 cm right renal cyst is noted. The patient has moderate to moderately severe bilateral hydroureteronephrosis. No stone is identified. The urinary bladder is decompressed with a Foley catheter in place. Stomach/Bowel: Stomach is within normal limits. Small hiatal hernia noted. The appendix is not visualized. No evidence of bowel wall thickening, distention, or inflammatory changes. Vascular/Lymphatic: Extensive atherosclerosis.  No aneurysm. Reproductive: Marked prostatomegaly. Other: There is extensive body wall edema and a small volume of abdominal and pelvic ascites. Musculoskeletal: Multifocal muscle edema is present and worst in the right gluteal musculature, left obturator internus and externus and pectineus muscles. There is also edema in the proximal thigh muscles, much worse on the right. Multifocal areas of dystrophic calcification are present in muscle, worst in the thighs and right buttock. Bones demonstrate scattered punctate lesions. There is a mild superior endplate compression fracture T12. There also are multiple areas of bony lucency. The L1 vertebral body appears mildly expanded. IMPRESSION: Scattered sclerotic and likely lytic lesions throughout the thoracic and lumbar spine worrisome for neoplastic process. Primary lesion is not identified. MRI of the thoracic spine with and without contrast is recommended for further evaluation. 3 hypoattenuating lesions in the liver cannot be definitively characterized. MRI with and without contrast  could be used for further evaluation. Based on imaging findings, MRI of the thoracic and lumbar spine is likely to be higher yield initially for cancer. Multifocal muscle edema and calcifications cannot be definitively characterized but are most worrisome for polymyositis. Consider consult with rheumatology and/or muscle biopsy. Moderate bilateral hydronephrosis is likely due to marked prostatomegaly. Anasarca with small bilateral pleural effusions and small volume of abdominal and pelvic ascites. Aortic Atherosclerosis (ICD10-I70.0) and Emphysema (ICD10-J43.9). Electronically Signed   By: Inge Rise M.D.   On: 09/20/2019 15:29   DG CHEST PORT 1 VIEW  Result Date: 09/15/2019 CLINICAL DATA:  PICC line placement. EXAM: PORTABLE CHEST 1 VIEW COMPARISON:  Chest CT 09/11/2019. FINDINGS: 1230 hours. Right arm PICC extends to approximately the superior cavoatrial junction, although its tip is partially obscured by overlap with the patient's left subclavian AICD leads. The heart size and mediastinal contours are stable status post median sternotomy and mitral valve surgery. Left pleural effusion and left basilar atelectasis have not significantly changed. There is no pneumothorax or pulmonary edema. IMPRESSION: Right arm PICC extends to approximately the superior cavoatrial junction. Stable left pleural effusion and left basilar atelectasis. Electronically Signed   By: Richardean Sale M.D.   On: 09/15/2019 12:45   DG Swallowing Func-Speech Pathology  Result Date: 09/16/2019 Objective Swallowing Evaluation: Type of Study: Bedside Swallow Evaluation  Patient Details Name: Zeal Stockley MRN: [id number] Date of Birth: [birth year] Today's Date: 09/16/2019 Time: SLP Start Time (ACUTE ONLY): 1305 -SLP Stop Time (ACUTE ONLY): 1330 SLP Time Calculation (min) (ACUTE ONLY): 25 min Past Medical History: Past Medical History: Diagnosis Date . Atrial fibrillation with RVR ([place])  . DVT (deep venous thrombosis) ([place])  . H/O mitral  valve replacement with mechanical valve  . Pacemaker 2001 . PE (pulmonary thromboembolism) ([place])  Past Surgical History: Past Surgical History: Procedure Laterality Date . MITRAL VALVE REPLACEMENT  2001 HPI: Syeed Rathgeber is a 69 year old male with a pertinent past medical history of A. fib with RVR, CABG status post pacemaker, mechanical mitral valve disease warfarin), myositis ossificans, CAD, who [facility].  [place] off a ladder back in November, had significant internal bleeding at that time, has not been the same since. Has not been eating for quite some time. Tried to have feeding tube placed on Sunday secondary to poor PO intake; however, daughter reported that the surgeon was unable to place a G-tube secondary to stomach inflammation and poor expansion. Per daughter report, pt underwent a FEES yesterday at [facility] and they reported that he is at risk for aspiration with all consistencies and they recommended 1/2 tsp of puree and tsp of nectar-thick liquid with alternative means of nutrition. Daughter mentions that he has had no appetite.  Subjective: Pt was alert and confused.  Family was present for a portion of this evalution. Assessment / Plan / Recommendation CHL IP CLINICAL IMPRESSIONS 09/16/2019 Clinical Impression Pt's swallow response was delayed to the level of the pyriforms.  Airway protection was adequate with nectar thick liquids via teaspoon and straw as well as purees.  Pt had trace aspiration of thin liquids via teaspoon due to decreased airway protection resulting from limited anterior laryngeal movement and he was unable to clear solids from his oral cavity due to dry mouth and weakened mastication.  Pt also had decreased clearance of boluses through the UES and his daughter relays a somewhat prolonged history of complaints consistent with esophageal dysphagia, including early satiety and belching throughout meals.  Esophageal sweep revealed stasis in the lower 1/3 of  esophagus.  Pt would benefit from an assessment of esophageal function. Recommend that pt be advanced to nectar thick liquids via straw to facilitate improved hydration and energy conservation with continuation of pureed solids.  Pt may be a good candidate for the water protocol with teaspoons of thin liquids in between meals to continue working towards liquids progression.   SLP Visit Diagnosis Dysphagia, pharyngoesophageal phase (R13.14);Dysphagia, oropharyngeal phase (R13.12) Attention and concentration deficit following -- Frontal lobe and executive function deficit following -- Impact on safety and function Moderate aspiration risk   CHL IP TREATMENT RECOMMENDATION 09/16/2019 Treatment Recommendations Therapy as outlined in treatment plan below   Prognosis 09/16/2019 Prognosis for Safe Diet Advancement Fair Barriers to Reach Goals Cognitive deficits Barriers/Prognosis Comment -- [facility] IP DIET RECOMMENDATION 09/16/2019 SLP Diet Recommendations Dysphagia 1 (Puree) solids;Nectar thick liquid Liquid Administration via Straw Medication Administration Crushed with puree Compensations Minimize environmental distractions;Slow rate;Small sips/bites Postural Changes Remain semi-upright after after feeds/meals (Comment);Seated upright at 90 degrees   CHL IP OTHER RECOMMENDATIONS 09/16/2019 Recommended Consults Consider esophageal assessment Oral Care Recommendations Oral care BID Other Recommendations Order thickener from pharmacy;Prohibited food (jello, ice cream, thin soups);Remove water pitcher   [facility] IP FOLLOW UP RECOMMENDATIONS 09/16/2019 Follow up Recommendations 24 hour supervision/assistance;Skilled Nursing facility   [facility] IP FREQUENCY AND DURATION 09/16/2019 Speech Therapy Frequency (ACUTE ONLY) min 2x/week Treatment Duration 2 weeks      [facility] IP ORAL PHASE 09/16/2019 Oral Phase Impaired Oral - Pudding Teaspoon -- Oral - Pudding Cup -- Oral - Honey Teaspoon -- Oral - Honey Cup -- Oral - Nectar Teaspoon -- Oral - Nectar Cup  -- Oral - Nectar Straw -- Oral - Thin Teaspoon -- Oral - Thin Cup -- Oral - Thin Straw -- Oral - Puree Lingual/palatal residue;Delayed oral transit;Decreased bolus cohesion;Reduced posterior propulsion Oral - Mech Soft -- Oral - Regular Impaired mastication;Delayed oral transit;Decreased bolus cohesion;Reduced posterior propulsion Oral - Multi-Consistency -- Oral - Pill -- Oral Phase - Comment --  [facility]L IP PHARYNGEAL PHASE 09/16/2019 Pharyngeal Phase  Impaired Pharyngeal- Pudding Teaspoon -- Pharyngeal -- Pharyngeal- Pudding Cup -- Pharyngeal -- Pharyngeal- Honey Teaspoon -- Pharyngeal -- Pharyngeal- Honey Cup -- Pharyngeal -- Pharyngeal- Nectar Teaspoon Delayed swallow initiation-pyriform sinuses Pharyngeal -- Pharyngeal- Nectar Cup -- Pharyngeal -- Pharyngeal- Nectar Straw Delayed swallow initiation-pyriform sinuses;Pharyngeal residue - cp segment;Reduced anterior laryngeal mobility Pharyngeal -- Pharyngeal- Thin Teaspoon Delayed swallow initiation-pyriform sinuses;Reduced anterior laryngeal mobility;Penetration/Aspiration during swallow;Trace aspiration;Pharyngeal residue - cp segment;Reduced airway/laryngeal closure Pharyngeal -- Pharyngeal- Thin Cup -- Pharyngeal -- Pharyngeal- Thin Straw -- Pharyngeal -- Pharyngeal- Puree Pharyngeal residue - cp segment;Delayed swallow initiation-pyriform sinuses;Reduced anterior laryngeal mobility Pharyngeal -- Pharyngeal- Mechanical Soft -- Pharyngeal -- Pharyngeal- Regular (No Data) Pharyngeal -- Pharyngeal- Multi-consistency -- Pharyngeal -- Pharyngeal- Pill -- Pharyngeal -- Pharyngeal Comment --  [facility]L IP CERVICAL ESOPHAGEAL PHASE 09/16/2019 Cervical Esophageal Phase Impaired Pudding Teaspoon -- Pudding Cup -- Honey Teaspoon -- Honey Cup -- Nectar Teaspoon -- Nectar Cup Reduced cricopharyngeal relaxation Nectar Straw Reduced cricopharyngeal relaxation Thin Teaspoon Reduced cricopharyngeal relaxation Thin Cup -- Thin Straw -- Puree Reduced cricopharyngeal relaxation Mechanical  Soft -- Regular -- Multi-consistency -- Pill -- Cervical Esophageal Comment -- Emilio Math 09/16/2019, 2:16 PM              EEG adult  Result Date: 09/05/2019 Alexis Goodell, MD     09/01/2019  9:23 PM ELECTROENCEPHALOGRAM REPORT Patient: Glendle Auvil       Room #: [id number] EEG No. ID: [id number] Age: 69 y.o.        Sex: male Requesting Physician: [employer] Report Date:  09/06/2019       Interpreting Physician: Alexis Goodell History: Eland Folkerts is an 69 y.o. male with altered mental status Medications: Heparin, Pacerone, Lipitor, Invanz, Lopressor, Flomax Conditions of Recording:  This is a 21 channel routine scalp EEG performed with bipolar and monopolar montages arranged in accordance to the international 10/20 system of electrode placement. One channel was dedicated to EKG recording. The patient is in the awake and drowsy states. Description:  Despite the state of the patient the background activity remains unchanged.  The background consists of a low voltage, symmetrical, fairly well organized, 6 Hz theta activity that is diffusely distributed.  No epileptiform activity is noted. Hyperventilation and intermittent photic stimulation were not performed. IMPRESSION: This is an abnormal EEG secondary to general background slowing.  This finding may be seen with a diffuse disturbance that is etiologically nonspecific, but may include a metabolic encephalopathy, among other possibilities.  No epileptiform activity was noted.  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 ANESTHESIA/SEDATION: Moderate (conscious) sedation was employed during this procedure. A total of Versed 0 mg and Fentanyl 25 mcg was administered intravenously. Moderate Sedation Time: 0 minutes. The patient's  level of consciousness and vital signs were monitored continuously by radiology nursing throughout the procedure under my direct supervision. FLUOROSCOPY TIME:  Ultrasound COMPLICATIONS: None PROCEDURE: Informed written consent was obtained from the patient after a thorough discussion of the procedural risks, benefits and alternatives. All questions were addressed. Maximal Sterile Barrier Technique was utilized including caps, mask, sterile gowns, sterile gloves, sterile drape, hand hygiene and skin antiseptic. A timeout was performed prior to the initiation of the procedure. Ultrasound survey was performed of the left supraclavicular region and the left posterior neck, with images stored and sent to PACs.  The left neck was prepped with chlorhexidine in a sterile fashion, and a sterile drape was applied covering the operative field. A sterile gown and sterile gloves were used for the procedure. Local anesthesia was provided with 1% Lidocaine. Ultrasound guidance was used to infiltrate the region with 1% lidocaine for local anesthesia. Multiple 16 gauge core biopsy were achieved of the infiltrative mass of the left supraclavicular region. Specimen placed into fresh specimen container Final image was stored after biopsy. Patient tolerated the procedure well and remained hemodynamically stable throughout. No complications were encountered and no significant blood loss was encounter IMPRESSION: Status post ultrasound-guided biopsy of infiltrative mass of the left supraclavicular region. Signed, Dulcy Fanny. Dellia Nims, RPVI [facility] Radiology Electronically Signed   By: Corrie Mckusick D.O.   On: 09/17/2019 12:02   CT HEAD CODE STROKE WO CONTRAST  Result Date: 08/28/2019 CLINICAL DATA:  Code stroke.  Slurred speech and facial droop. EXAM: CT HEAD WITHOUT CONTRAST TECHNIQUE: Contiguous axial images were obtained from the base of the skull through the vertex without  intravenous contrast. COMPARISON:  None. FINDINGS: Brain: Advanced atrophy. Extensive white matter disease bilaterally which is symmetric and most consistent with chronic microvascular ischemia. Negative for acute infarct, hemorrhage, mass. Vascular: Normal arterial flow voids. Skull: Negative Sinuses/Orbits: Paranasal sinuses clear.  Negative orbit. Other: None ASPECTS ([place] Stroke Program Early CT Score) - Ganglionic level infarction (caudate, lentiform nuclei, internal capsule, insula, M1-M3 cortex): 7 - Supraganglionic infarction (M4-M6 cortex): 3 Total score (0-10 with 10 being normal): 10 IMPRESSION: 1. No acute abnormality 2. ASPECTS is 10 3. Advanced atrophy and chronic microvascular ischemic change. 4. Preliminary results texted to [place] via AMION Electronically Signed   By: Franchot Gallo M.D.   On: 09/13/2019 11:00   DG ESOPHAGUS W SINGLE CM (SOL OR THIN BA)  Result Date: 09/19/2019 CLINICAL DATA:  Esophageal dysphagia. EXAM: ESOPHOGRAM/BARIUM SWALLOW TECHNIQUE: Single contrast examination was performed using  thin barium. FLUOROSCOPY TIME:  Fluoroscopy Time:  3 minutes and 30 seconds Radiation Exposure Index (if provided by the fluoroscopic device): 22.0 mGy Number of Acquired Spot Images: 0 COMPARISON:  Chest CT 09/11/2019 FINDINGS: Limited examination due to piecemeal swallowing. Nonspecific esophageal motility disorder with occasional disruption of the primary peristaltic wave and occasional tertiary contractions. There was moderate esophageal stasis. Long segment of smooth strictured narrowing along the GE junction. Could not exclude esophageal neoplasm. Recommend endoscopic evaluation. IMPRESSION: 1. Nonspecific esophageal motility disorder. 2. Long segment smooth strictured narrowing along the GE junction. Could not exclude esophageal neoplasm. Recommend endoscopic evaluation. Electronically Signed   By: Marijo Sanes M.D.   On: 09/19/2019 13:18    ASSESSMENT & PLAN: 69 yo male   1.   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  The primary is likely esophagus 2.  Acute hypoxic respite failure, on nonrebreather.  Etiology could be aspiration pneumonia, versus CHF 3.  Severe protein and calorie malnutrition 4.  Failure to thrive 5.  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 DNR    Recommendations: -I have reviewed his CT images, and his recent lymph node biopsy results.  I discussed with patient's daughter Dorian Pod at the bedside in details. -Unfortunately this is metastatic squamous cell carcinoma, with the primary likely esophageal cancer, given his progressive dysphagia.  Head and neck cancer as a primary is also a possibility, although CT neck was negative for primary  lesion in the neck area. -Due to his acute respiratory failure, his prognosis is extremely poor, his expected life expectancy is likely a few days to a week.  His poor nutritional status and poor performance status would exclude the possibility of any anticancer treatment.  I recommend comfort care and residential hospice.  I discussed with his daughter Zenia Resides in details, she is in complete agreement. -Please reconsult the palliative care, or call [facility] liaison, to transition him to residential hospice. -Antony Haste would like her siblings and other family member to visit him, I spoke with charge nurse on the floor.  -I communicated with the primary care team Dr. Rebeca Alert about the above.  -please call us if there is more questions.   All questions were answered. The patient knows to call the clinic with any problems, questions or concerns.      Truitt Merle, MD 09/16/2019 5:11 PM

## 2019-09-21 NOTE — Consult Note (Signed)
WOC Nurse Consult Note: [facility] [phone].  Daughter in room. I evaluated the sacral wound with PT prior to hydrotherapy.  Hydrotherapy and santyl are appropriate to continue, with re-evaluation next week.  The patient has developed a couple of small skin tears on the right anterior and posterior shoulder areas.  For those I have ordered:  Cleanse all skin tears with soap and water. Pat dry.  Place Mepitel Kellie Simmering [phone]) over the skin tears (currently right anterior and posterior shoulder areas). Cover with Foam dressing.  Change every 2 days. Monitor the wound area(s) for worsening of condition such as: Signs/symptoms of infection,  Increase in size,  Development of or worsening of odor, Development of pain, or increased pain at the affected locations.  Notify the medical team if any of these develop. Val Riles, RN, MSN, CWOCN, CNS-BC, pager [phone]

## 2019-09-21 NOTE — Progress Notes (Signed)
Physical Therapy Wound Treatment Patient Details  Name: Jorge Kennedy MRN: [id number] Date of Birth: February 13, 1951  Today's Date: 08/31/2019 Time: 7416-3845 Time Calculation (min): 27 min  Subjective  Subjective: Pt agitated and restless with increased tremors Patient and Family Stated Goals: improved wound healing and increased protein intake Prior Treatments: Santyl and dressing changes  Pain Score:  Premedicated  Wound Assessment  Pressure Injury 09/15/19 Sacrum Posterior Stage 3 -  Full thickness tissue loss. Subcutaneous fat may be visible but bone, tendon or muscle are NOT exposed. sarum pressure ulcer from facility (Active)  Dressing Type ABD;Barrier Film (skin prep);Gauze (Comment);Moist to moist;Other (Comment) 09/22/2019 1146  Dressing Changed;Clean;Dry;Intact 09/10/2019 1146  Dressing Change Frequency Daily 09/03/2019 1146  State of Healing Eschar 09/09/2019 1146  Site / Wound Assessment Brown;Black;Yellow;Red;Pink;Painful;Granulation tissue 08/27/2019 1146  % Wound base Red or Granulating 15% 09/19/2019 1146  % Wound base Yellow/Fibrinous Exudate 45% 09/01/2019 1146  % Wound base Black/Eschar 40% 09/17/2019 1146  Peri-wound Assessment Erythema (blanchable);Edema;Pink 09/20/2019 1146  Wound Length (cm) 10.6 cm 09/19/19 1000  Wound Width (cm) 8.2 cm 09/19/19 1000  Wound Depth (cm) 1.5 cm 09/19/19 1000  Wound Surface Area (cm^2) 86.92 cm^2 09/19/19 1000  Wound Volume (cm^3) 130.38 cm^3 09/19/19 1000  Undermining (cm) 1 cm 11 o'clock to 4 o'clock 09/17/19 1215  Margins Unattached edges (unapproximated) 09/10/2019 1146  Drainage Amount Moderate 08/31/2019 1146  Drainage Description Serosanguineous;Green 09/03/2019 1146  Treatment Hydrotherapy (Pulse lavage);Packing (Saline gauze) 09/19/2019 1146   Santyl applied to wound bed prior to applying dressing.     Hydrotherapy Pulsed lavage therapy - wound location: sacrum Pulsed Lavage with Suction (psi): 12 psi(8-12) Pulsed Lavage with Suction - Normal  Saline Used: 1000 mL Pulsed Lavage Tip: Tip with splash shield   Wound Assessment and Plan  Wound Therapy - Assess/Plan/Recommendations Wound Therapy - Clinical Statement: Sharp debridement deferred this session as pt restless, agitated, on NRB and attempting to pull it off. Also with increased tremors. Able to tolerate pulsatile lavage and dressing change. Noted some green drainage on old dressing; [place] RN present and aware. Will continue to benefit from hydrotherapy to decreased bioburden to promote healing. Wound Therapy - Functional Problem List: decreased mobility  Factors Delaying/Impairing Wound Healing: Multiple medical problems;Immobility;Other (comment)(protein malnutrition ) Hydrotherapy Plan: Debridement;Pulsatile lavage with suction;Patient/family education;Dressing change Wound Therapy - Frequency: 6X / week Wound Therapy - Follow Up Recommendations: Skilled nursing facility Wound Plan: see above  Wound Therapy Goals- Improve the function of patient's integumentary system by progressing the wound(s) through the phases of wound healing (inflammation - proliferation - remodeling) by: Decrease Necrotic Tissue to: 50 Decrease Necrotic Tissue - Progress: Progressing toward goal Increase Granulation Tissue to: 50 Increase Granulation Tissue - Progress: Progressing toward goal Goals/treatment plan/discharge plan were made with and agreed upon by patient/family: Yes Time For Goal Achievement: 7 days Wound Therapy - Potential for Goals: Fair  Goals will be updated until maximal potential achieved or discharge criteria met.  Discharge criteria: when goals achieved, discharge from hospital, MD decision/surgical intervention, no progress towards goals, refusal/missing three consecutive treatments without notification or medical reason.  GP    Wyona Almas, PT, DPT Acute Rehabilitation Services Pager [phone] Office [phone]      Jorge Kennedy 09/13/2019, 11:52  AM

## 2019-09-21 NOTE — Progress Notes (Signed)
Daily Rounding Note  09/24/2019, 8:47 AM  LOS: 7 days   SUBJECTIVE:   Chief complaint: Dysphagia, weight loss, long esophageal stricture.  Cancer on neck biopsy. Yesterday he was increasingly somnolent.  Last night he was placed on nonrebreather mask due to hypoxia though not clinically in respiratory distress.  OBJECTIVE:         Vital signs in last 24 hours:    Temp:  [97.5 F (36.4 C)-99 F (37.2 C)] 99 F (37.2 C) (05/28 0846) Pulse Rate:  [75-99] 75 (05/28 0846) Resp:  [18-20] 20 (05/28 0846) BP: (94-116)/(61-79) 102/65 (05/28 0846) SpO2:  [64 %-98 %] 98 % (05/28 0846) Last BM Date: 09/19/19 Filed Weights   08/28/2019 1118  Weight: 52.2 kg   General: Looks very ill.  Resting with nonrebreather in place.  No effort made to wake up the patient. Heart: RRR Chest: No labored breathing Abdomen: Nondistended Extremities: No CCE Neuro/Psych: Resting, was not awakened for neurologic evaluation  Intake/Output from previous day: 05/27 0701 - 05/28 0700 In: 364.6 [I.V.:274.5; IV Piggyback:90.1] Out: 850 [Urine:850]  Intake/Output this shift: No intake/output data recorded.  Lab Results: Recent Labs    09/19/19 0519 09/20/19 0500 09/11/2019 0436  WBC 10.3 9.4 8.3  HGB 9.6* 9.2* 9.2*  HCT 31.8* 30.3* 30.1*  PLT 251 217 228   BMET Recent Labs    09/20/19 0602 09/20/19 1900 09/15/2019 0436  NA 146* 146* 144  K 3.3* 4.2 3.3*  CL 120* 121* 119*  CO2 20* 20* 20*  GLUCOSE 153* 85 98  BUN 10 9 10   CREATININE 0.43* 0.37* 0.43*  CALCIUM 7.2* 6.6* 7.3*   LFT Recent Labs    09/20/19 0602 09/20/2019 0436  PROT 3.5* 3.7*  ALBUMIN 1.2* 1.2*  AST 15 18  ALT 12 13  ALKPHOS 150* 154*  BILITOT 0.8 0.3   PT/INR Recent Labs    09/20/19 0500 09/20/19 1900  LABPROT 26.5* 25.5*  INR 2.5* 2.4*   Hepatitis Panel No results for input(s): HEPBSAG, HCVAB, HEPAIGM, HEPBIGM in the last 72  hours.  Studies/Results: DG Chest 1 View  Result Date: 09/20/2019 CLINICAL DATA:  Shortness of breath. EXAM: CHEST  1 VIEW COMPARISON:  Sep 15, 2019. FINDINGS: Stable cardiomediastinal silhouette. Left-sided pacemaker is unchanged in position. Status post cardiac valve repair. Atherosclerosis of thoracic aorta is noted. Right-sided PICC line is unchanged in position. No pneumothorax is noted. Increased bilateral diffuse lung opacities are noted concerning for worsening edema or possibly inflammation. Stable mild left pleural effusion is noted. Bony thorax is unremarkable. IMPRESSION: Aortic atherosclerosis. Increased bilateral diffuse lung opacities are noted concerning for worsening edema or possibly inflammation. Stable mild left pleural effusion is noted. Aortic Atherosclerosis (ICD10-I70.0). Electronically Signed   By: Marijo Conception M.D.   On: 09/20/2019 17:12   DG ESOPHAGUS W SINGLE CM (SOL OR THIN BA)  Result Date: 09/19/2019 CLINICAL DATA:  Esophageal dysphagia. EXAM: ESOPHOGRAM/BARIUM SWALLOW TECHNIQUE: Single contrast examination was performed using  thin barium. FLUOROSCOPY TIME:  Fluoroscopy Time:  3 minutes and 30 seconds Radiation Exposure Index (if provided by the fluoroscopic device): 22.0 mGy Number of Acquired Spot Images: 0 COMPARISON:  Chest CT 09/20/2019 FINDINGS: Limited examination due to piecemeal swallowing. Nonspecific esophageal motility disorder with occasional disruption of the primary peristaltic wave and occasional tertiary contractions. There was moderate esophageal stasis. Long segment of smooth strictured narrowing along the GE junction. Could not exclude esophageal neoplasm. Recommend endoscopic evaluation.  IMPRESSION: 1. Nonspecific esophageal motility disorder. 2. Long segment smooth strictured narrowing along the GE junction. Could not exclude esophageal neoplasm. Recommend endoscopic evaluation. Electronically Signed   By: Marijo Sanes M.D.   On: 09/19/2019 13:18    Scheduled Meds: . amiodarone  200 mg Oral BID  . atorvastatin  80 mg Oral Daily  . Chlorhexidine Gluconate Cloth  6 each Topical Daily  . collagenase   Topical Daily  . FLUoxetine  10 mg Oral Daily  . melatonin  10 mg Oral QHS  . metoprolol tartrate  5 mg Intravenous Q6H  . nicotine  21 mg Transdermal Daily  . sodium chloride flush  10-40 mL Intracatheter Q12H   Continuous Infusions: . sodium chloride Stopped (09/12/2019 0305)  . fluconazole (DIFLUCAN) IV 200 mg (09/19/19 1700)  . potassium chloride 10 mEq (09/14/2019 0757)   PRN Meds:.sodium chloride, Gerhardt's butt cream, HYDROmorphone (DILAUDID) injection, Resource ThickenUp Clear, senna-docusate, sodium chloride flush   ASSESMENT:   *Dysphagia, weight loss.Unsuccessful attempt to place G-tube at hospital in [place]. 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   Thus far have not been able to pursue EGD, due to above target INR, added to that this morning is hypoxia requiring NRB mask.  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 Suspicious for malignancy. Nodal biopsy 5/24.? If this iscause for his swallowing issues?  *Acute left subclavian DVT.  IV Heparin in place.    *Atrial fibrillation, mechanical MVR.DVT, PE. Chronic Coumadin,currently on hold with heparin drip in place.. INR 1.8 at arrivalbut 2.6 today despite no Warfarin since PTA  *Protein calorie malnutrition.  *Normocytic anemia.    PLAN   *    At this point GI will sign off.  We are happy to pursue EGD when patient is clinically stable and INR reaches less than or equal to 2. However given the diagnosis of cancer infiltrating in the neck, not clear EGD is going to add any information that is going to change clinical management. For now okay to resume IV heparin and clear liquid diet, nectar thick  liquids.    Azucena Freed  09/01/2019, 8:47 AM Phone [phone]

## 2019-09-21 NOTE — Consult Note (Signed)
Palliative Medicine Inpatient Consult Note  Reason for consult:  Comfort Care / Hospice Placement  HPI:  Per intake H&P --> Patient is a 69 year old Caucasian male, with past medical history of coronary artery disease, status post CABG and mechanical valve placement, pacemaker, heavy smoker, presented with progressive dysphagia, weight loss.  He was admitted to [facility] on Sep 14, 2019.  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  Palliative care was asked to get involved in the setting of Keanthony's advanced metastatic squamous cell CA. He has unfortunately declined while in house from a respiratory state. Oncology strongly recommends comfort care and possible residential hospice placement in the setting of his advanced cancer and acute respiratory failure.  Clinical Assessment/Goals of Care: I have reviewed medical records including EPIC notes, labs and imaging, received report from bedside RN, assessed the patient.  Danney noted to be on a nonrebreather, extremely tachypneic.    I met with Jannet Mantis at bedside to further discuss diagnosis prognosis, GOC, EOL wishes, disposition and options.   I introduced Palliative Medicine as specialized medical care for people living with serious illness. It focuses on providing relief from the symptoms and stress of a serious illness. The goal is to improve quality of life for both the patient and the family.  I asked Dorian Pod to tell me about Clemente. Olanda is from [place] originally. He served in [employer] during the [place] era. He was a member of the [employer]. He was a very well respected man in his community. His children all went into jobs of service after him.  We talked about Muaz's decline since March. Discussed how rapidly his cancer and the symptoms associated with it had progressed. We discussed presently his situation and degree of suffering he may be experiencing. Dorian Pod had just spoken to Oncology prior to my  meeting with her and she expresses understanding of his poor situation.  A detailed discussion was had today regarding advanced directives, Dorian Pod is the patient primary decision maker.    Concepts specific to code status, artifical feeding and hydration, continued IV antibiotics and rehospitalization was had.  The difference between a aggressive medical intervention path  and a palliative comfort care path for this patient at this time was had. At this point Kengo is DNAR/DNI and his family would like to focus on comfort oriented care.  We talked about transition to comfort measures in house and what that would entail inclusive of medications to control pain, dyspnea, agitation, nausea, itching, and hiccups.  We discussed stopping all uneccessary measures such as blood draws, needle sticks, and frequent vital signs.   Explained that our job throughout the process would be to ease the burden of suffering. Utilized reflective listening throughout our time together as Dorian Pod was understandable tearful.  Decision Maker: Eino Whitner (Daughter) - [phone]  SUMMARY OF RECOMMENDATIONS   DNAR/DNI  Transition to comfort care  Will place a TOC referral to Residential Hospice when patient appears stable otherwise would anticipate an in hospital death within hours to days  Chaplain Support  Code Status/Advance Care Planning: DNAR/DNI   Symptom Management:  Dyspnea: Pain:  - Morphine gtt  - Morphine 36m bolus Q30M PRN Fever:  - Tylenol 652mPO/PR Q6H PRN Agitation: Anxiety:  - Lorazepam  0.5-39m57mV Q1H PRN Nausea:  - Zofran 4mg72m/IV Q6H PRN  Secretions:  - Glycopyrrolate 0.4mg 47mQ4H PRN Dry Eyes:  - Artificial Tears PRN Xerostomia:  - Biotene 15ml 39m -  BID oral care Urinary Retention:  - Maintain foley  Constipation:  - Bisacodyl 23m PR PRN QDay Spiritual:  - Chaplain consult  - Patients Pastor Plans to come in  Palliative Prophylaxis:   Turn Q2H, constipation, pain,  dyspnea  Additional Recommendations (Limitations, Scope, Preferences):  Comfort Measures  Psycho-social/Spiritual:   Desire for further Chaplaincy support: Yes  Additional Recommendations: Education on end of life care   Prognosis: Hours to Days, anticipate in hospital death  Discharge Planning: [facility] Discharge  PPS: 10%   This conversation/these recommendations were discussed with patient primary care team, Dr. RRebeca Alert Time In: 1800 Time Out: 1910 Total Time: 778Greater than 50%  of this time was spent counseling and coordinating care related to the above assessment and plan.  [place] Cell Phone: [phone] utilize secure chat with additional questions, if there is no response within 30 minutes please call the above phone number  Palliative Medicine Team providers are available by phone from 7am to 7pm daily and can be reached through the team cell phone.  Should this patient require assistance outside of these hours, please call the patient's attending physician.

## 2019-09-21 NOTE — Progress Notes (Signed)
HD#7 Subjective:  Overnight Events: Patient had acute respiratory failure with desaturation requiring non-rebreather at 15L.  Jorge Kennedy Patient appears more alert today, but still pretty sleepy. He is on NRB at 15 L, but does not appear to be struggling to breath. He spiked a low grade fever today at 100.84F. Patient's family were update with pathology results. He is frail with multiple skin tears and ecchymosis.   Objective:  Vital signs in last 24 hours: Vitals:   09/20/19 1607 09/20/19 1944 09/24/2019 0037 09/09/2019 0100  BP:  97/72 100/61   Pulse: 88 95 99   Resp: 18 20 20    Temp:  98.5 F (36.9 C) 98.4 F (36.9 C)   TempSrc:  Oral Oral   SpO2: 97% 97% (!) 64% 96%  Weight:      Height:       Supplemental O2: Non Rebreather SpO2: 96 % O2 Flow Rate (L/min): 15 L/min   Physical Exam:  Physical Exam  Constitutional: He appears cachectic. He has a sickly appearance.  HENT:  Head: Atraumatic.  Eyes: EOM are normal.  Cardiovascular: Normal rate and intact distal pulses.  Pulmonary/Chest: Effort normal. He has rales.  Abdominal: Soft. He exhibits no distension. There is no abdominal tenderness.  Musculoskeletal:        General: Edema (left arm) present. No tenderness.     Cervical back: Normal range of motion.  Neurological: He is alert.  Skin: Skin is warm and dry.    Filed Weights   09/17/2019 1118  Weight: 52.2 kg     Intake/Output Summary (Last 24 hours) at 08/29/2019 0613 Last data filed at 09/10/2019 0310 Gross per 24 hour  Intake 364.63 ml  Output 950 ml  Net -585.37 ml   Net IO Since Admission: 4,319.14 mL [09/14/2019 0613]  Pertinent Labs: CBC Latest Ref Rng & Units 09/10/2019 09/20/2019 09/19/2019  WBC 4.0 - 10.5 K/uL 8.3 9.4 10.3  Hemoglobin 13.0 - 17.0 g/dL 9.2(L) 9.2(L) 9.6(L)  Hematocrit 39.0 - 52.0 % 30.1(L) 30.3(L) 31.8(L)  Platelets 150 - 400 K/uL 228 217 251    CMP Latest Ref Rng & Units 09/20/2019 09/20/2019 09/19/2019  Glucose 70 - 99 mg/dL  85 153(H) 105(H)  BUN 8 - 23 mg/dL 9 10 9   Creatinine 0.61 - 1.24 mg/dL 0.37(L) 0.43(L) 0.44(L)  Sodium 135 - 145 mmol/L 146(H) 146(H) 148(H)  Potassium 3.5 - 5.1 mmol/L 4.2 3.3(L) 3.3(L)  Chloride 98 - 111 mmol/L 121(H) 120(H) 121(H)  CO2 22 - 32 mmol/L 20(L) 20(L) 19(L)  Calcium 8.9 - 10.3 mg/dL 6.6(L) 7.2(L) 7.4(L)  Total Protein 6.5 - 8.1 g/dL - 3.5(L) -  Total Bilirubin 0.3 - 1.2 mg/dL - 0.8 -  Alkaline Phos 38 - 126 U/L - 150(H) -  AST 15 - 41 U/L - 15 -  ALT 0 - 44 U/L - 12 -    Pending Labs: none  Imaging: CXR: Aortic atherosclerosis. Increased bilateral diffuse lung opacities are noted concerning for worsening edema or possibly inflammation. Stable mild left pleural effusion is noted. Aortic Atherosclerosis (ICD10-I70.0).  Barium Swallow: 1. Nonspecific esophageal motility disorder. 2. Long segment smooth strictured narrowing along the GE junction. Could not exclude esophageal neoplasm. Recommend endoscopic evaluation.  Lft Lymph Node Bx: LEFT NECK INFILTRATIVE MASS, NEEDLE CORE BIOPSY:  - Poorly differentiated carcinoma.    Assessment/Plan:   Principal Problem:   Protein calorie malnutrition (HCC) Active Problems:   Encephalopathy   CAD (coronary artery disease)   Myositis  DVT of axillary vein, chronic left (HCC)   History of pulmonary embolism   History of mitral valve replacement with mechanical valve   Aortic atherosclerosis (HCC)   Suprapubic catheter (HCC)   Bilateral hydronephrosis   Metastatic malignant neoplasm (HCC)   Neck mass   Pressure injury of skin   Protein-calorie malnutrition, severe   Dysphagia   Patient Summary: Jorge Kennedy is a 69 y.o. who  has a past medical history of Atrial fibrillation with RVR ([place]), DVT (deep venous thrombosis) ([place]), H/O mitral valve replacement with mechanical valve, Pacemaker (2001), and PE (pulmonary thromboembolism) ([place])., presented with subacute altered mental status and admit for  cognitive/functional declineand admit for subacute encephalopathy and failure to thrive on hospital day 8  Poorly differentiated carcinoma, unknown primary: Declining functional status, Failure to thrive: Subclavian VTE: - Biopsy of the left supraclavicular lymph node showed poorly differentiated carcinoma and barium swallow shows signs and symptoms concerning for distal esophageal malignancy. -Vitamin K started yesterday INR today is 1.4, but EGD cancelled due to acute hypoxic respiratory failure.  - Oncology consulted for recommendations regarding work up and management of malignancy.  -Appreciate GI assistance comanaging this complex patient.  Acute hypoxic respiratory failure: Fever -Patient had an episode of acute hypoxic respiratory failure overnight.  He is still on 15 L nonrebreather to maintain saturations greater than 92. -Concern for worsening pulmonary congestion in the setting of his significant cardiovascular history versus possible pneumonia. -Patient has a tenuous functional respiratory status with significant past history of inpatient hospitalization concerning for an increased risk of multidrug resistant organisms, therefore we will empirically start vancomycin and Zosyn for broad-spectrum antibiotic coverage. -We will get blood cultures, urine cultures, and monitor CBC for changes in leukocyte count  CAD s/p CABGand mechanical mitral valveon chronic warfarin Sick sinus syndromes/p pacemaker HLD - Patient has increased bilateral diffuse lung opacities are noted concerning for worsening edema or possibly inflammation. - Received 10 mg Lasix with 800 cc urine out - Patient looks comfortable resting in bed, we will continue to monitor foe need for more diuretics.   Atrial fibrillation: - Continue amiodarone 200 mg twice daily and metoprolol 25 mg twice daily -Patient's warfarin was held due to concern for need for procedures, Continue heparin.  SevereProtein  caloriemalnutrition: Hypernatremia: Hypocalcemia:  Hypokalemia: - continue monitoring and replete as needed.  Sacral decubitus ulcer: -Appreciate wound care's assistance managing this issue.  Diet: NPO for EGD IVF: D5,35cc/hr VTE: Heparin Code: DNR PT/OT recs: Pending TOC recs: none  Dispo: Anticipated discharge pending further clinical evaluation and management.   Diet: Dysphagia 1 IVF: None,None VTE: Heparin Code: DNR PT/OT recs: None TOC recs: None   Dispo: Anticipated discharge pending further evaluation and management.    [name], D.O. [facility], PGY-1 Date 09/22/2019 Time 6:13 AM Pager: [phone]

## 2019-09-22 DIAGNOSIS — Z66 Do not resuscitate: Secondary | ICD-10-CM

## 2019-09-22 DIAGNOSIS — Z7189 Other specified counseling: Secondary | ICD-10-CM

## 2019-09-22 DIAGNOSIS — Z515 Encounter for palliative care: Secondary | ICD-10-CM

## 2019-09-22 LAB — URINE CULTURE: Culture: NO GROWTH

## 2019-09-22 NOTE — Progress Notes (Signed)
The chaplain visited with the daughter of the patient. The chaplain spoke with the nurse and is aware that this is a patient that is dying. The chaplain will follow-up as needed and as family arrives.  Brion Aliment Chaplain Resident For questions concerning this note please contact me by pager [phone]

## 2019-09-22 NOTE — Progress Notes (Signed)
HD#8 Subjective:  Overnight Events: transition to comfort care  [name] resting comfortably in bed on NRB.  Objective:  Vital signs in last 24 hours: Vitals:   08/28/2019 1603 09/01/2019 2057 09/22/19 0003 09/22/19 0615  BP: (!) 91/54 (!) 110/24 116/61 (!) 124/57  Pulse: 75 78 87 74  Resp: 20 18 19 19   Temp: 99.2 F (37.3 C) 99 F (37.2 C) 98.3 F (36.8 C) 99.1 F (37.3 C)  TempSrc: Axillary Oral Oral Oral  SpO2: 95% 98% 99% 95%  Weight:      Height:       Supplemental O2: Non Rebreather SpO2: 95 % O2 Flow Rate (L/min): 15 L/min FiO2 (%): (!) 19 %   Physical Exam:  Physical Exam  Constitutional: He appears cachectic. He has a sickly appearance.  HENT:  Head: Atraumatic.  Cardiovascular: Normal rate and intact distal pulses.  Pulmonary/Chest: He is in respiratory distress.  Abdominal: Soft. He exhibits no distension.  Musculoskeletal:        General: Edema present.  Neurological:  Somnolent and lethargic with fluctuating alertness/orientation  Skin: Skin is warm and dry.    Filed Weights   09/13/2019 1118  Weight: 52.2 kg     Intake/Output Summary (Last 24 hours) at 09/22/2019 0748 Last data filed at 09/22/2019 0408 Gross per 24 hour  Intake 129.18 ml  Output 350 ml  Net -220.82 ml   Net IO Since Admission: 4,098.32 mL [09/22/19 0748]  Pertinent Labs: CBC Latest Ref Rng & Units 09/16/2019 09/20/2019 09/19/2019  WBC 4.0 - 10.5 K/uL 8.3 9.4 10.3  Hemoglobin 13.0 - 17.0 g/dL 9.2(L) 9.2(L) 9.6(L)  Hematocrit 39.0 - 52.0 % 30.1(L) 30.3(L) 31.8(L)  Platelets 150 - 400 K/uL 228 217 251    CMP Latest Ref Rng & Units 09/07/2019 09/20/2019 09/20/2019  Glucose 70 - 99 mg/dL 98 85 153(H)  BUN 8 - 23 mg/dL 10 9 10   Creatinine 0.61 - 1.24 mg/dL 0.43(L) 0.37(L) 0.43(L)  Sodium 135 - 145 mmol/L 144 146(H) 146(H)  Potassium 3.5 - 5.1 mmol/L 3.3(L) 4.2 3.3(L)  Chloride 98 - 111 mmol/L 119(H) 121(H) 120(H)  CO2 22 - 32 mmol/L 20(L) 20(L) 20(L)  Calcium 8.9 - 10.3  mg/dL 7.3(L) 6.6(L) 7.2(L)  Total Protein 6.5 - 8.1 g/dL 3.7(L) - 3.5(L)  Total Bilirubin 0.3 - 1.2 mg/dL 0.3 - 0.8  Alkaline Phos 38 - 126 U/L 154(H) - 150(H)  AST 15 - 41 U/L 18 - 15  ALT 0 - 44 U/L 13 - 12     Assessment/Plan:   Principal Problem:   Protein calorie malnutrition (HCC) Active Problems:   Encephalopathy   CAD (coronary artery disease)   Myositis   DVT of axillary vein, chronic left (HCC)   History of pulmonary embolism   History of mitral valve replacement with mechanical valve   Aortic atherosclerosis (HCC)   Suprapubic catheter (HCC)   Bilateral hydronephrosis   Metastatic malignant neoplasm (HCC)   Neck mass   Pressure injury of skin   Protein-calorie malnutrition, severe   Dysphagia   Palliative care by specialist   End of life care   Goals of care, counseling/discussion    Patient Summary: Jorge Hammeris a 69 y.o.who has a past medical history of Atrial fibrillation with RVR ([place]), DVT (deep venous thrombosis) ([place]), H/O mitral valve replacement with mechanical valve, Pacemaker (2001), and PE (pulmonary thromboembolism) ([place])., presented withsubacute altered mental statusand admit forcognitive/functional declineand admit for subacute encephalopathy and failure to thriveon hospital  day 8  Squamous cell carcinoma of the esophagus Failure to thrive -Oncology consulted for likely squamous cell carcinoma of the esophagus.  Patient's family was consulted on the patient's prognosis and elected to transition to comfort care considering his significant cognitive/functional decline. -Palliative care consulted for goals of care.  Patient was transitioned to comfort care overnight. -We will try to transfer him to inpatient hospice, but I likely anticipate an in-hospital death due to worsening decline in patient's clinical status.  Code: Comfort Care  Dispo: Anticipated discharge to inpatient hospice vs in hospital death.    [name],  D.O. [facility], PGY-1 Date 09/22/2019 Time 7:48 AM Pager: [phone]

## 2019-09-22 NOTE — Progress Notes (Signed)
Family requested no vital signs be taken. Will continue to monitor pt.

## 2019-09-22 NOTE — Progress Notes (Signed)
Palliative Medicine Inpatient Follow Up Note   Reason for consult:  Comfort Care / Hospice Placement  HPI:  Per intake H&P --> Patient is a 69 year old Caucasian male, with past medical history of coronary artery disease, status post CABG and mechanical valve placement, pacemaker, heavy smoker, presented with progressive dysphagia, weight loss.He was admitted to [facility] on Sep 14, 2019. 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  Palliative care was asked to get involved in the setting of Jorge Kennedy's advanced metastatic squamous cell CA. He has unfortunately declined while in house from a respiratory state. Oncology strongly recommends comfort care and possible residential hospice placement in the setting of his advanced cancer and acute respiratory failure.  Today's Discussion (09/22/2019): Chart reviewed. Met with bedside RN to review overnight symptoms. Patient required a x1 dose of ativan around midnight. Otherwise has appeared comfortable per nursing. I met with Jorge Kennedy at bedside this morning. [name] appear dyspneic therefore bolused him and increased his morphine gtt. We removed his nonrebreather thereafter. Educated Jorge Kennedy on the use of supplemental oxygen at the end of life and the lack of literature supportive of any true benefit. If anything it can at to forestall death contributing to increased suffering.   Jorge Kennedy was able to share more about her fathers life with me. She stated that her family is driving down from [place] and Jorge Kennedy's friends are driving up from [place]. Jorge Kennedy was encouraged to call the Palliative care team if anyone would like additional updates.   Questions and concerns addressed   SUMMARY OF RECOMMENDATIONS DNAR/DNI  Transition to comfort care  Will place a TOC referral to Residential Hospice when patient appears stable otherwise would anticipate an in hospital death within hours to days  Chaplain Support  Code  Status/Advance Care Planning: DNAR/DNI  Symptom Management: Dyspnea: Pain:                 - Morphine gtt                 - Morphine 70m bolus Q30M PRN Fever:                 - Tylenol 6554mPO/PR Q6H PRN Agitation: Anxiety:                 - Lorazepam  0.5-49m449mV Q1H PRN Nausea:                 - Zofran 4mg16m/IV Q6H PRN             Secretions:                 - Glycopyrrolate 0.4mg 77mQ4H PRN Dry Eyes:                 - Artificial Tears PRN Xerostomia:                 - Biotene 15ml 57m                - BID oral care Urinary Retention:                 - Maintain foley  Constipation:                 - Bisacodyl 10mg P54mN QDay Spiritual:                 - Chaplain consult                 -  Patients Pastor Plans to come in  Time Spent: 35 Greater than 50% of the time was spent in counseling and coordination of care ______________________________________________________________________________________ [place] Team Team Cell Phone: [phone] Please utilize secure chat with additional questions, if there is no response within 30 minutes please call the above phone number  Palliative Medicine Team providers are available by phone from 7am to 7pm daily and can be reached through the team cell phone.  Should this patient require assistance outside of these hours, please call the patient's attending physician.

## 2019-09-23 DIAGNOSIS — G253 Myoclonus: Secondary | ICD-10-CM

## 2019-09-23 DIAGNOSIS — Z515 Encounter for palliative care: Secondary | ICD-10-CM

## 2019-09-23 MED ORDER — LORAZEPAM 2 MG/ML IJ SOLN
0.5000 mg | Freq: Three times a day (TID) | INTRAMUSCULAR | Status: DC
  Administered 2019-09-23: 0.5 mg via INTRAVENOUS
  Filled 2019-09-23: qty 1

## 2019-09-25 NOTE — Progress Notes (Signed)
Pt kept comfortable throughout the shift as family members visited  Called to room at 1910 to auscultate and confirm cardiac death.

## 2019-09-25 NOTE — Death Summary Note (Addendum)
  Name: Jorge Kennedy MRN: [id number] DOB: 01-23-1951 69 y.o.  Date of Admission: 09/22/2019 10:38 AM Date of Discharge: 09/24/2019 Attending Physician: Dr. Rebeca Alert  Discharge Diagnosis: Principal Problem:   Metastatic malignant neoplasm [facility]) Active Problems:   Encephalopathy   Protein calorie malnutrition ([place])   CAD (coronary artery disease)   Myositis   DVT of axillary vein, chronic left (HCC)   History of pulmonary embolism   History of mitral valve replacement with mechanical valve   Aortic atherosclerosis (HCC)   Suprapubic catheter (HCC)   Bilateral hydronephrosis   Neck mass   Pressure injury of skin   Protein-calorie malnutrition, severe   Dysphagia   Palliative care by specialist   End of life care   Goals of care, counseling/discussion   DNR (do not resuscitate)   Myoclonic jerking   Palliative care encounter   Cause of death: Cancer/Failure to thrive Time of death: 19:25  Disposition and follow-up:   [name] was [facility] from [facility] in expired condition.    Hospital Course: Gaither Nuding presented to [facility] with a 6 months history of progressive decline in functional and cognitive status.  Patient was found to have enlarged left supraclavicular lymph node.  Biopsy showed poorly-differentiated squamous cell carcinoma.  Barium swallow showed likely obstructing mass in the distal esophagus.  Patient's clinical status progressively declined which did not allow [place] to perform an upper endoscopy/biopsy of this area.  Hematology was consulted and counseled the patient and his family on the prognosis.  Patient progressively became more somnolent and was transitioned to comfort care.  Patient had a peaceful in-hospital death on 10/08/2019 at 19:25 surrounded by his family.  Signed: [name], MD 09/24/2019, 11:09 AM

## 2019-09-25 NOTE — Progress Notes (Signed)
HD#9 Subjective:  Overnight Events: transition to comfort care  [name] resting comfortably in bed on NRB.  Objective:  Vital signs in last 24 hours: Vitals:   08/30/2019 2057 09/22/19 0003 09/22/19 0615 09/22/19 0821  BP: (!) 110/24 116/61 (!) 124/57 (!) 86/26  Pulse: 78 87 74 80  Resp: 18 19 19 13   Temp: 99 F (37.2 C) 98.3 F (36.8 C) 99.1 F (37.3 C) 100.1 F (37.8 C)  TempSrc: Oral Oral Oral Axillary  SpO2: 98% 99% 95% (!) 42%  Weight:      Height:       Supplemental O2: Non Rebreather SpO2: (!) 42 %(RN Notified) O2 Flow Rate (L/min): 15 L/min FiO2 (%): (!) 19 %   Physical Exam:  Physical Exam  Cardiovascular: Intact distal pulses.  Pulmonary/Chest: He is in respiratory distress.  Neurological: He displays tremor.  Somnolent   Skin: Skin is warm.    Filed Weights   09/10/2019 1118  Weight: 52.2 kg     Intake/Output Summary (Last 24 hours) at 09/26/19 1228 Last data filed at September 26, 2019 1100 Gross per 24 hour  Intake 60.88 ml  Output --  Net 60.88 ml   Net IO Since Admission: 4,159.2 mL [Sep 26, 2019 1228]  Pertinent Labs: CBC Latest Ref Rng & Units 09/07/2019 09/20/2019 09/19/2019  WBC 4.0 - 10.5 K/uL 8.3 9.4 10.3  Hemoglobin 13.0 - 17.0 g/dL 9.2(L) 9.2(L) 9.6(L)  Hematocrit 39.0 - 52.0 % 30.1(L) 30.3(L) 31.8(L)  Platelets 150 - 400 K/uL 228 217 251    CMP Latest Ref Rng & Units 09/24/2019 09/20/2019 09/20/2019  Glucose 70 - 99 mg/dL 98 85 153(H)  BUN 8 - 23 mg/dL 10 9 10   Creatinine 0.61 - 1.24 mg/dL 0.43(L) 0.37(L) 0.43(L)  Sodium 135 - 145 mmol/L 144 146(H) 146(H)  Potassium 3.5 - 5.1 mmol/L 3.3(L) 4.2 3.3(L)  Chloride 98 - 111 mmol/L 119(H) 121(H) 120(H)  CO2 22 - 32 mmol/L 20(L) 20(L) 20(L)  Calcium 8.9 - 10.3 mg/dL 7.3(L) 6.6(L) 7.2(L)  Total Protein 6.5 - 8.1 g/dL 3.7(L) - 3.5(L)  Total Bilirubin 0.3 - 1.2 mg/dL 0.3 - 0.8  Alkaline Phos 38 - 126 U/L 154(H) - 150(H)  AST 15 - 41 U/L 18 - 15  ALT 0 - 44 U/L 13 - 12      Assessment/Plan:   Principal Problem:   Protein calorie malnutrition (HCC) Active Problems:   Encephalopathy   CAD (coronary artery disease)   Myositis   DVT of axillary vein, chronic left (HCC)   History of pulmonary embolism   History of mitral valve replacement with mechanical valve   Aortic atherosclerosis (HCC)   Suprapubic catheter (HCC)   Bilateral hydronephrosis   Metastatic malignant neoplasm (HCC)   Neck mass   Pressure injury of skin   Protein-calorie malnutrition, severe   Dysphagia   Palliative care by specialist   End of life care   Goals of care, counseling/discussion   DNR (do not resuscitate)    Patient Summary: Jorge Kennedy a 69 y.o.who has a past medical history of Atrial fibrillation with RVR ([place]), DVT (deep venous thrombosis) ([place]), H/O mitral valve replacement with mechanical valve, Pacemaker (2001), and PE (pulmonary thromboembolism) ([place])., presented withsubacute altered mental statusand admit forcognitive/functional declineand admit for subacute encephalopathy and failure to thrive.   Squamous cell carcinoma of the esophagus Failure to thrive GOC -Patient displays slowly deteriorating clinical status. -Continue comfort care -She palliative assistance. -Discharged to inpatient hospice if possible  Code: Comfort  Care  Dispo: Anticipated discharge to inpatient hospice vs in hospital death.    [name], D.O. [facility], PGY-1 Date 10/11/2019 Time 12:28 PM Pager: [phone]

## 2019-09-25 NOTE — Progress Notes (Addendum)
Cardiac death confirmed at 1925. Family at bedside.  MD notified of pt death.  Family declined autopsy.

## 2019-09-25 NOTE — Progress Notes (Signed)
Daily Progress Note   Patient Name: Jorge Kennedy       Date: 10/04/2019 DOB: 1951-02-25  Age: 69 y.o. MRN#: [id number] Attending Physician: Oda Kilts, MD Primary Care Physician: System, Pcp Not In Admit Date: 09/21/2019  Reason for Consultation/Follow-up: Non pain symptom management, Pain control, Psychosocial/spiritual support and Terminal Care     Subjective: Patient unable to speak.  Family members at bedside.  Wife expressed concern over patient's comfort.  He appears to be having myoclonic jerking and tremor.  At times the movements are small, but other times the movements are significant and patient appears to be having some distress.  We discussed initiating scheduled benzodiazepine for the movements.   Will also wean off oxygen - giving increased morphine to prevent any respiratory distress.  Discussed with family that oxygen could be prolonging the dying process.  Family does not want his current condition prolonged.   They are concerned about making certain he is comfortable.   Assessment: Actively dying.  Dark urine.  Decreased amount.  Patient has not responded meaningfully for two days.  With myoclonic jerking worrisome for possible discomfort.   Patient Profile/HPI:  Per intake H&P -->Patient is a 69 year old Caucasian male, with past medical history of coronary artery disease, status post CABG and mechanical valve placement, pacemaker, heavy smoker, presented with progressive dysphagia, weight loss.He was admitted to [facility] on Sep 14, 2019. 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  Length of Stay: 9   Vital Signs: BP (!) 79/44 (BP Location: Left Leg) Comment: RN Notified  Pulse 85   Temp 99.7 F (37.6 C) (Axillary)   Resp (!) 8  Comment: RN Notified  Ht 5\' 8"  (1.727 m)   Wt 52.2 kg   SpO2 90%   BMI 17.49 kg/m  SpO2: SpO2: 90 % O2 Device: O2 Device: NRB O2 Flow Rate: O2 Flow Rate (L/min): 15 L/min       Palliative Assessment/Data: 10     Palliative Care Plan    Recommendations/Plan: Initiate scheduled ativan for myoclonic jerking Increase morphine while simultaneously decreasing oxygen. Short letter written for son's FMLA documentation. Would not transfer outside of hospital - I don't think he would survive an ambulance ride to hospice facility. PMT will continue to follow for symptom management.  Code Status:  DNR  Prognosis:  Hours - Days   Discharge Planning: Anticipated Hospital Death  Care plan was discussed with family.  Thank you for allowing the Palliative Medicine Team to assist in the care of this patient.  Total time spent:  35 min.     Greater than 50%  of this time was spent counseling and coordinating care related to the above assessment and plan.  Florentina Jenny, PA-C Palliative Medicine  Please contact Palliative MedicineTeam phone at [phone] for questions and concerns between 7 am - 7 pm.   Please see AMION for individual provider pager numbers.

## 2019-09-25 DEATH — deceased

## 2019-09-26 LAB — CULTURE, BLOOD (ROUTINE X 2)
Culture: NO GROWTH
Culture: NO GROWTH
Special Requests: ADEQUATE

## 2021-10-09 IMAGING — CT CT HEAD CODE STROKE
3 series · 15 of 47 positions shown, 18 images · non-contrast
Comparison: None.

CLINICAL DATA: Code stroke.  Slurred speech and facial droop.

EXAM:
CT HEAD WITHOUT CONTRAST
TECHNIQUE: Contiguous axial images were obtained from the base of the skull
through the vertex without intravenous contrast.

[Series 3: head 5.0 st · axial · 0.45mm/px · z∈[-85,+50]mm · 9 of 33 slices shown, 12 images]
[im 3/33  brain]
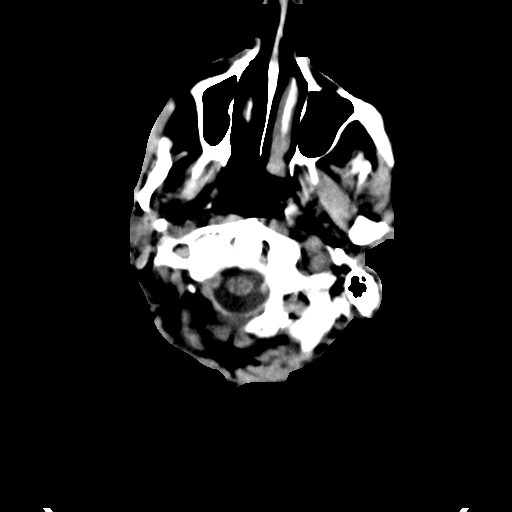
[im 3/33  bone]
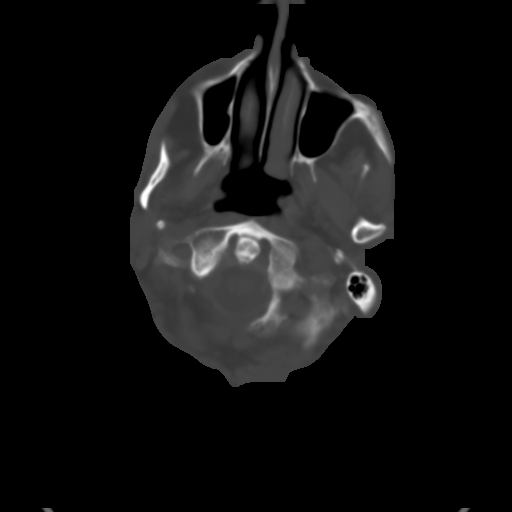
[im 6/33  brain]
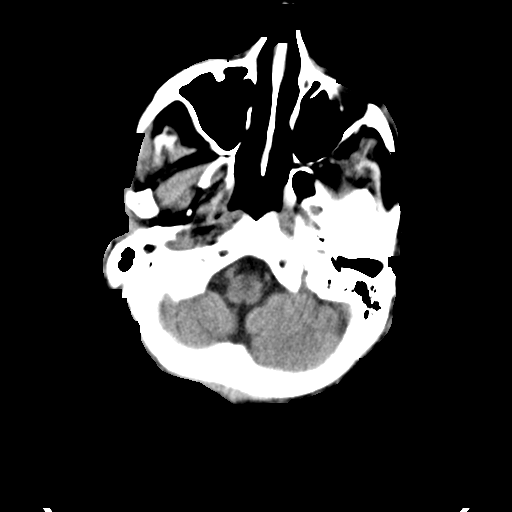
[im 9/33  brain]
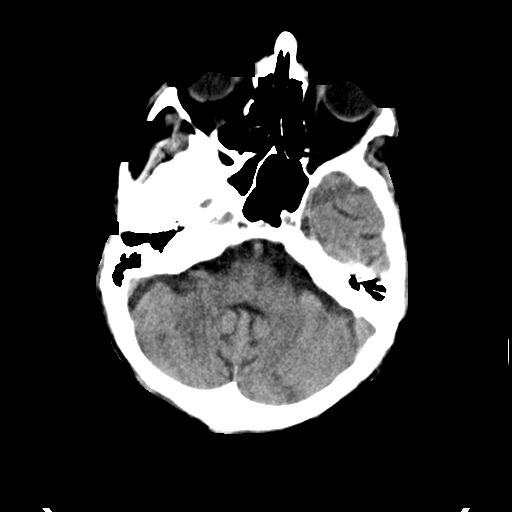
[im 13/33  brain]
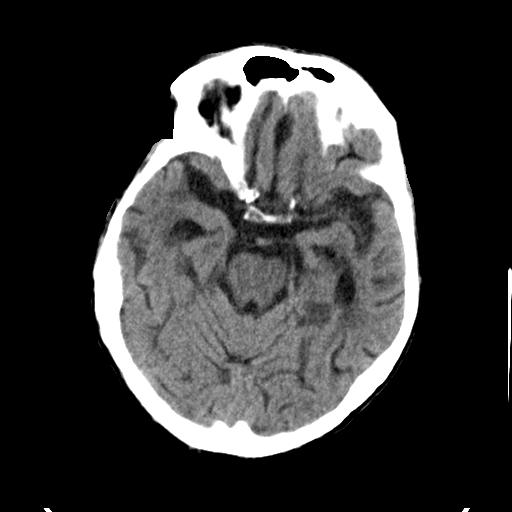
[im 17/33  brain]
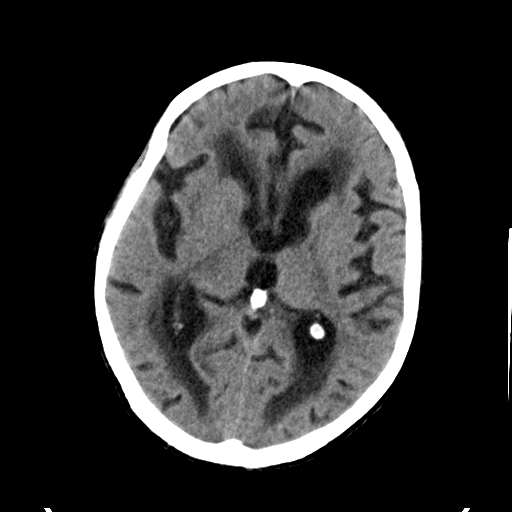
[im 17/33  bone]
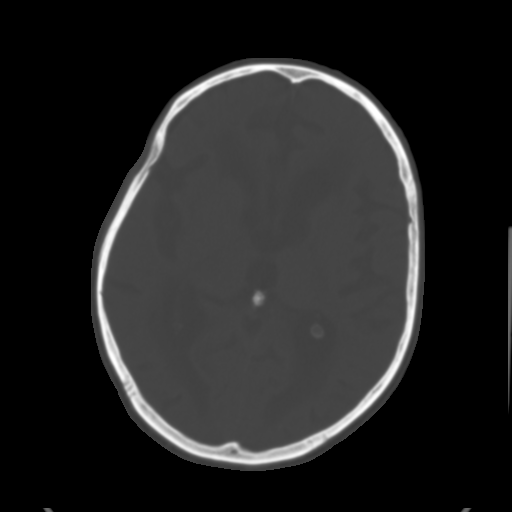
[im 20/33  brain]
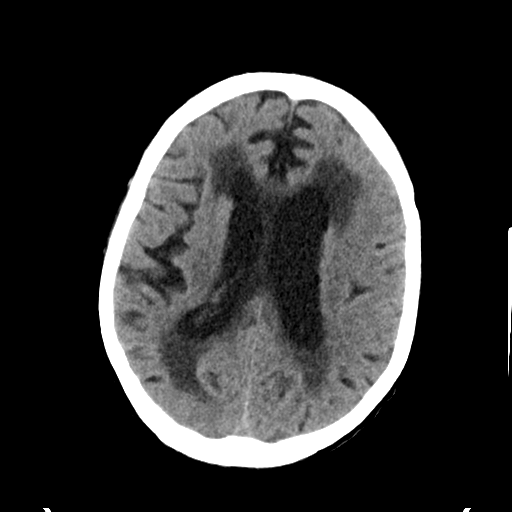
[im 24/33  brain]
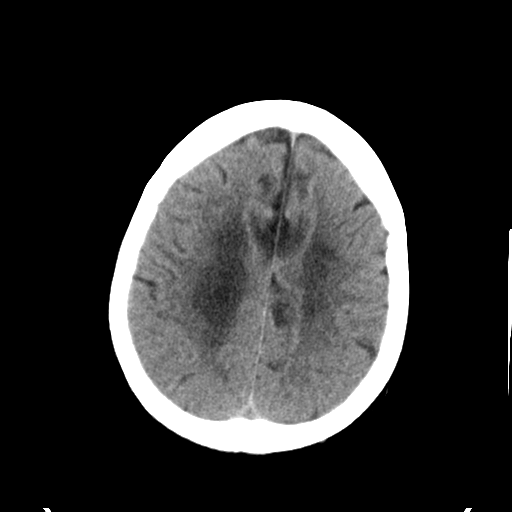
[im 27/33  brain]
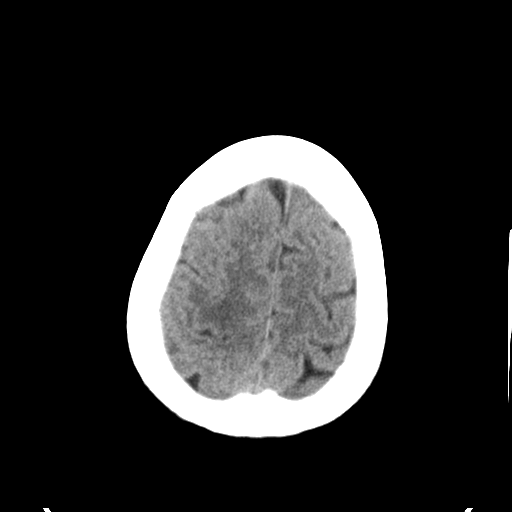
[im 30/33  brain]
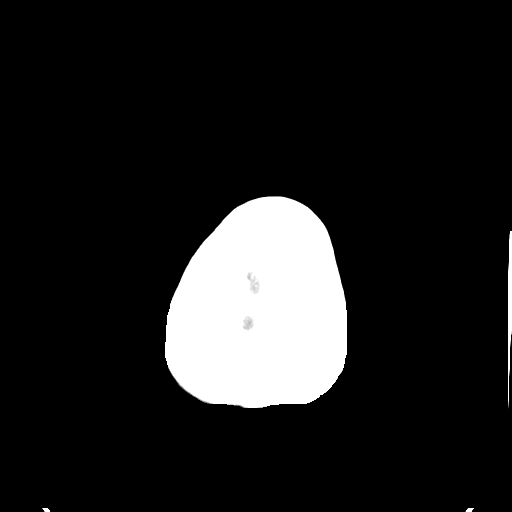
[im 30/33  bone]
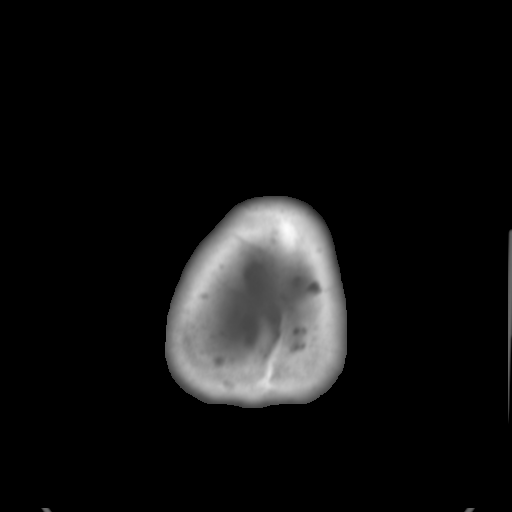

[Series 5: head 3.0 cor st · coronal · 0.32mm/px · 3 of 67 slices shown]
[im 23/67  brain]
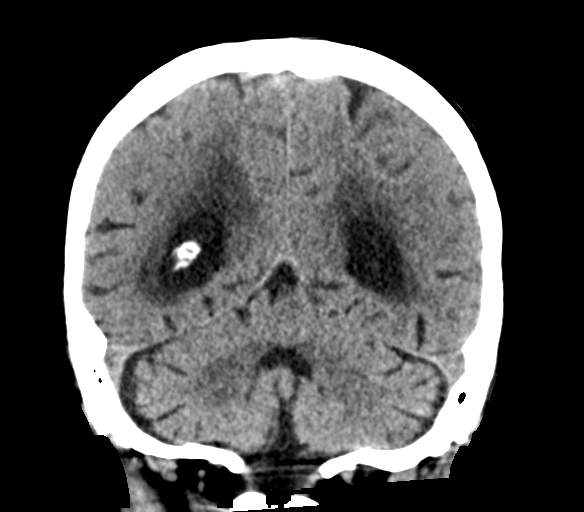
[im 30/67  brain]
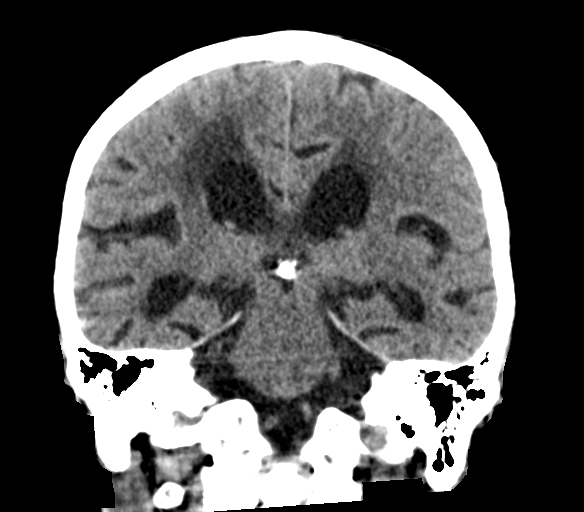
[im 37/67  brain]
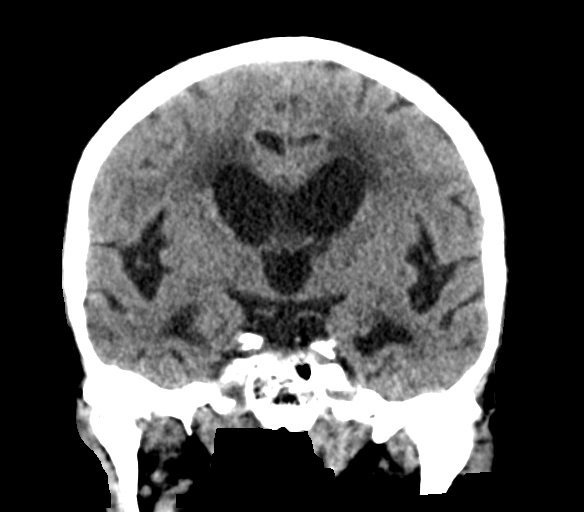

[Series 6: head 3.0 sag st · sagittal · 0.32mm/px · 3 of 56 slices shown]
[im 19/56  brain]
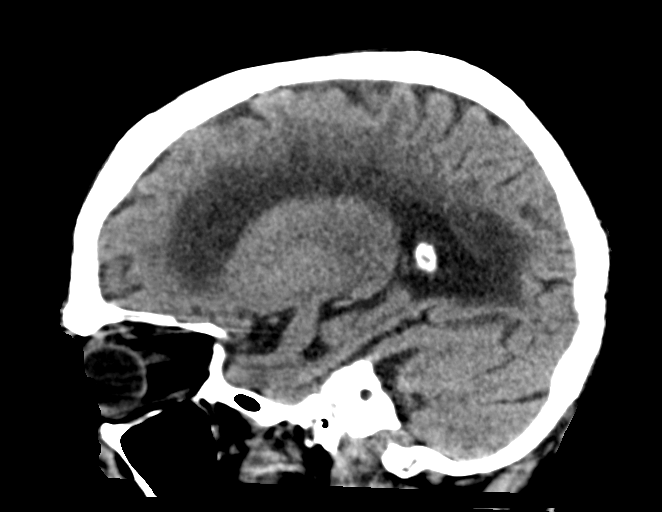
[im 28/56  brain]
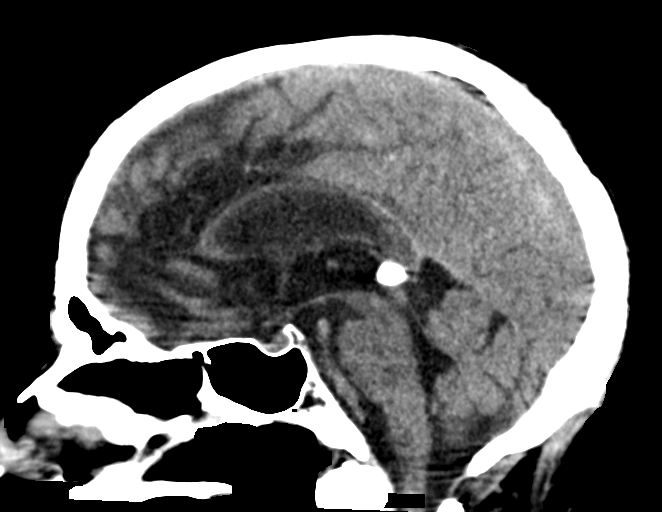
[im 37/56  brain]
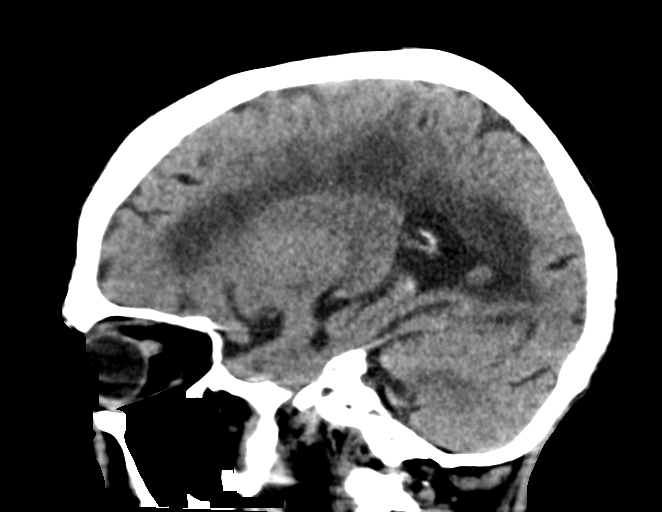

[15 of 47 positions shown; findings below may reference images not displayed]

FINDINGS: Brain: Advanced atrophy. Extensive white matter disease bilaterally
which is symmetric and most consistent with chronic microvascular
ischemia.

Negative for acute infarct, hemorrhage, mass.

Vascular: Normal arterial flow voids.

Skull: Negative

Sinuses/Orbits: Paranasal sinuses clear.  Negative orbit.

Other: None

ASPECTS (Alberta Stroke Program Early CT Score)

- Ganglionic level infarction (caudate, lentiform nuclei, internal
capsule, insula, M1-M3 cortex): 7

- Supraganglionic infarction (M4-M6 cortex): 3

Total score (0-10 with 10 being normal): 10
IMPRESSION: 1. No acute abnormality
2. ASPECTS is 10
3. Advanced atrophy and chronic microvascular ischemic change.
4. Preliminary results texted to Dr.China via AMION

## 2021-10-15 IMAGING — DX DG CHEST 1V
1 series · 1 of 1 positions shown · non-contrast
Comparison: September 15, 2019.

CLINICAL DATA: Shortness of breath.

EXAM:
CHEST  1 VIEW

[chest ap]
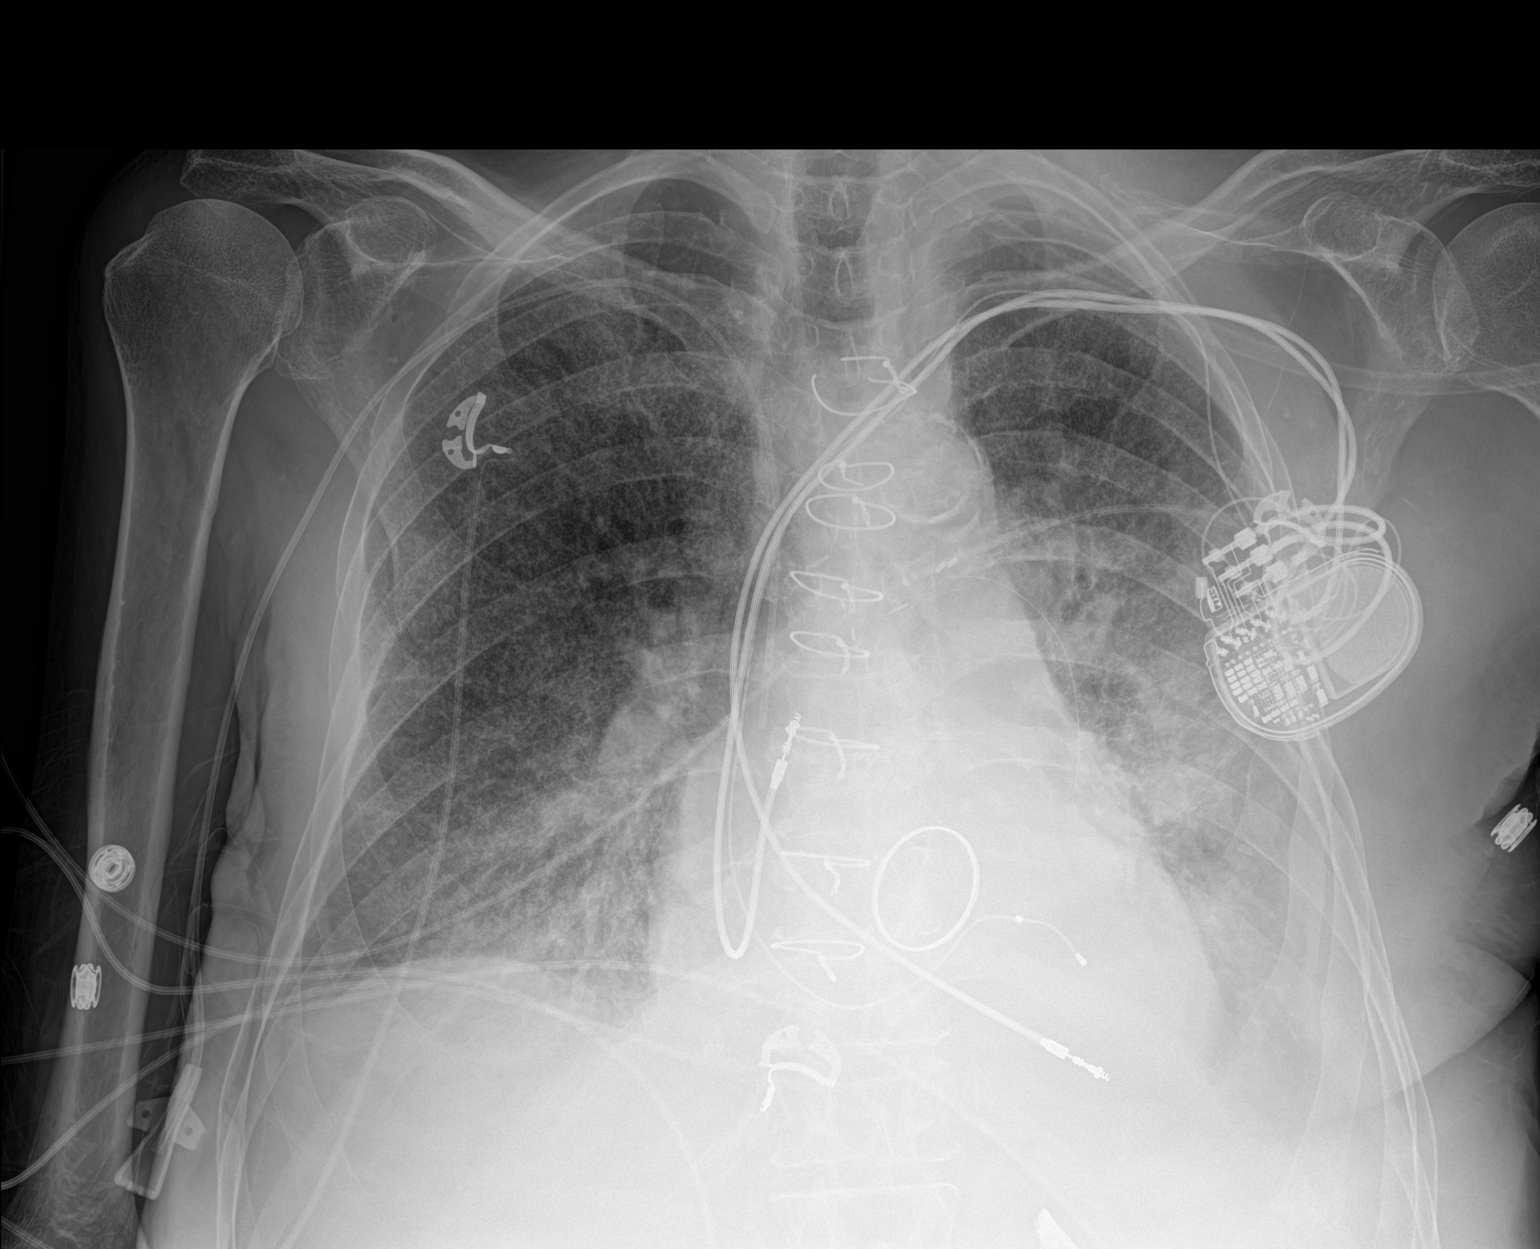

[1 of 1 positions shown; findings below may reference images not displayed]

FINDINGS: Stable cardiomediastinal silhouette. Left-sided pacemaker is
unchanged in position. Status post cardiac valve repair.
Atherosclerosis of thoracic aorta is noted. Right-sided PICC line is
unchanged in position. No pneumothorax is noted. Increased bilateral
diffuse lung opacities are noted concerning for worsening edema or
possibly inflammation. Stable mild left pleural effusion is noted.
Bony thorax is unremarkable.
IMPRESSION: Aortic atherosclerosis. Increased bilateral diffuse lung opacities
are noted concerning for worsening edema or possibly inflammation.
Stable mild left pleural effusion is noted.

Aortic Atherosclerosis ([id number]-ZXM.M).
